# Patient Record
Sex: Male | Born: 1985 | Race: White | Hispanic: No | Marital: Married | Smoking: Former smoker
Health system: Southern US, Community
[De-identification: ages and names within clinical notes are randomized; demographics above are authoritative.]

## PROBLEM LIST (undated history)

## (undated) DIAGNOSIS — F32A Depression, unspecified: Secondary | ICD-10-CM

## (undated) DIAGNOSIS — T7840XA Allergy, unspecified, initial encounter: Secondary | ICD-10-CM

## (undated) DIAGNOSIS — L409 Psoriasis, unspecified: Secondary | ICD-10-CM

## (undated) HISTORY — PX: CHOLECYSTECTOMY: SHX55

## (undated) HISTORY — PX: TONSILLECTOMY: SUR1361

## (undated) HISTORY — DX: Allergy, unspecified, initial encounter: T78.40XA

## (undated) HISTORY — DX: Depression, unspecified: F32.A

---

## 2015-01-06 ENCOUNTER — Emergency Department
Admission: EM | Admit: 2015-01-06 | Discharge: 2015-01-06 | Disposition: A | Discharge status: Home / Self Care | Payer: 59 | Source: Home / Self Care | Attending: Family Medicine | Admitting: Family Medicine

## 2015-01-06 ENCOUNTER — Encounter: Payer: Self-pay | Admitting: *Deleted

## 2015-01-06 DIAGNOSIS — H6121 Impacted cerumen, right ear: Secondary | ICD-10-CM | POA: Diagnosis not present

## 2015-01-06 DIAGNOSIS — H6091 Unspecified otitis externa, right ear: Secondary | ICD-10-CM

## 2015-01-06 HISTORY — DX: Psoriasis, unspecified: L40.9

## 2015-01-06 MED ORDER — NEOMYCIN-POLYMYXIN-HC 3.5-10000-1 OT SUSP: 4.0000 [drp] | Freq: Four times a day (QID) | OTIC | Status: DC

## 2015-01-06 NOTE — ED Notes (Signed)
Pt c/o right ear fullness since last week with decreased hearing. Minimal pain.

## 2015-01-06 NOTE — ED Provider Notes (Signed)
CSN: [id number]     Arrival date & time 01/06/15  1217 History   First MD Initiated Contact with Patient 01/06/15 1238     Chief Complaint  Patient presents with  . Ear Fullness   (Consider location/radiation/quality/duration/timing/severity/associated sxs/prior Treatment) HPI  Pt is a 29yo male presenting to [facility] with c/o Right ear "fullness" and pain that started about 1 week ago.  Pt also reports mild decreased hearing in Right ear compared to Left. Reports mild nasal congestion that is intermittent, associated sneezing but states he has been around a lot of dust recently and believes the congestion is due to allergies.  No other symptoms.  Denies fever, chills, n/v/d. Denies sore throat.  Past Medical History  Diagnosis Date  . Psoriasis    Past Surgical History  Procedure Laterality Date  . Tonsillectomy     History reviewed. No pertinent family history. Social History  Substance Use Topics  . Smoking status: Former Research scientist (life sciences)  . Smokeless tobacco: Never Used  . Alcohol Use: Yes    Review of Systems  Constitutional: Negative for fever and chills.  HENT: Positive for congestion, ear pain (Right), hearing loss ("fullness") and sneezing. Negative for rhinorrhea, sinus pressure and sore throat.   Respiratory: Negative for cough and shortness of breath.   Gastrointestinal: Negative for nausea, vomiting, abdominal pain and diarrhea.    Allergies  Ceclor; Penicillins; and Sulfur  Home Medications   Prior to Admission medications   Medication Sig Start Date End Date Taking? Authorizing Provider  neomycin-polymyxin-hydrocortisone (CORTISPORIN) 3.5-10000-1 otic suspension Place 4 drops into the right ear 4 (four) times daily. X 7 days 01/06/15   Noland Fordyce, PA-C   Meds Ordered and Administered this Visit  Medications - No data to display  BP 115/74 mmHg  Pulse 73  Temp(Src) 98.1 F (36.7 C) (Oral)  Resp 14  Ht 5\' 9"  (1.753 m)  Wt 193 lb (87.544 kg)  BMI 28.49 kg/m2   SpO2 100% No data found.   Physical Exam  Constitutional: He is oriented to person, place, and time. He appears well-developed and well-nourished.  HENT:  Head: Normocephalic and atraumatic.  Right Ear: Hearing and tympanic membrane normal. There is swelling and tenderness.  Left Ear: Hearing, tympanic membrane, external ear and ear canal normal.  Right ear canal: small erythematous pustule on anterior wall of canal, erythematous canal.  Partial cerumen impaction. TM- normal.   Eyes: EOM are normal.  Neck: Normal range of motion.  Cardiovascular: Normal rate.   Pulmonary/Chest: Effort normal. No respiratory distress.  Musculoskeletal: Normal range of motion.  Neurological: He is alert and oriented to person, place, and time.  Skin: Skin is warm and dry.  Psychiatric: He has a normal mood and affect. His behavior is normal.  Nursing note and vitals reviewed.   ED Course  Procedures (including critical care time)  Labs Review Labs Reviewed - No data to display  Imaging Review No results found.   MDM   1. Acute otitis externa, right   2. Cerumen impaction, right     Pt c/o Right ear fullness. Partial cerumen impaction in Right ear with evidence of acute otitis externa. Cerumen removal performed by nurse. Pt reported immediate symptom relief. Rx: cortisporin otic drops for 7 days. F/u with PCP in 1 week if not improving, sooner if worsening. May take acetaminophen and ibuprofen for fever or pain. Patient verbalized understanding and agreement with treatment plan.    Noland Fordyce, PA-C 01/06/15 [phone]

## 2015-01-06 NOTE — Discharge Instructions (Signed)
Ear Drops, Adult °You have been diagnosed with a condition requiring you to put drops of medicine into your outer ear. °HOME CARE INSTRUCTIONS  °· Put drops in the affected ear as instructed. After putting the drops in, you will need to lie down with the affected ear facing up for ten minutes so the drops will remain in the ear canal and run down and fill the canal. Continue using the ear drops for as long as directed by your health care provider. °· Prior to getting up, put a cotton ball gently in your ear canal. Leave enough of the cotton ball out so it can be easily removed. Do not attempt to push this down into the canal with a cotton-tipped swab or other instrument. °· Do not irrigate or wash out your ears if you have had a perforated eardrum or mastoid surgery, or unless instructed to do so by your health care provider. °· Keep appointments with your health care provider as instructed. °· Finish all medicine, or use for the length of time prescribed by your health care provider. Continue the drops even if your problem seems to be doing well after a couple days, or continue as instructed. °SEEK MEDICAL CARE IF: °· You become worse or develop increasing pain. °· You notice any unusual drainage from your ear (particularly if the drainage has a bad smell). °· You develop hearing difficulties. °· You experience a serious form of dizziness in which you feel as if the room is spinning, and you feel nauseated (vertigo). °· The outside of your ear becomes red or swollen or both. This may be a sign of an allergic reaction. °MAKE SURE YOU:  °· Understand these instructions. °· Will watch your condition. °· Will get help right away if you are not doing well or get worse. °  °This information is not intended to replace advice given to you by your health care provider. Make sure you discuss any questions you have with your health care provider. °  °Document Released: 02/06/2001 Document Revised: 03/05/2014 Document  Reviewed: 09/09/2012 °Elsevier Interactive Patient Education ©2016 Elsevier Inc[employer] ° °Otitis Externa °Otitis externa is a bacterial or fungal infection of the outer ear canal. This is the area from the eardrum to the outside of the ear. Otitis externa is sometimes called "swimmer's ear." °CAUSES  °Possible causes of infection include: °· Swimming in dirty water. °· Moisture remaining in the ear after swimming or bathing. °· Mild injury (trauma) to the ear. °· Objects stuck in the ear (foreign body). °· Cuts or scrapes (abrasions) on the outside of the ear. °SIGNS AND SYMPTOMS  °The first symptom of infection is often itching in the ear canal. Later signs and symptoms may include swelling and redness of the ear canal, ear pain, and yellowish-white fluid (pus) coming from the ear. The ear pain may be worse when pulling on the earlobe. °DIAGNOSIS  °Your health care provider will perform a physical exam. A sample of fluid may be taken from the ear and examined for bacteria or fungi. °TREATMENT  °Antibiotic ear drops are often given for 10 to 14 days. Treatment may also include pain medicine or corticosteroids to reduce itching and swelling. °HOME CARE INSTRUCTIONS  °· Apply antibiotic ear drops to the ear canal as prescribed by your health care provider. °· Take medicines only as directed by your health care provider. °· If you have diabetes, follow any additional treatment instructions from your health care provider. °· Keep all follow-up visits   as directed by your health care provider. PREVENTION   Keep your ear dry. Use the corner of a towel to absorb water out of the ear canal after swimming or bathing.  Avoid scratching or putting objects inside your ear. This can damage the ear canal or remove the protective wax that lines the canal. This makes it easier for bacteria and fungi to grow.  Avoid swimming in lakes, polluted water, or poorly chlorinated pools.  You may use ear drops made of rubbing alcohol and  vinegar after swimming. Combine equal parts of white vinegar and alcohol in a bottle. Put 3 or 4 drops into each ear after swimming. SEEK MEDICAL CARE IF:   You have a fever.  Your ear is still red, swollen, painful, or draining pus after 3 days.  Your redness, swelling, or pain gets worse.  You have a severe headache.  You have redness, swelling, pain, or tenderness in the area behind your ear. MAKE SURE YOU:   Understand these instructions.  Will watch your condition.  Will get help right away if you are not doing well or get worse.   This information is not intended to replace advice given to you by your health care provider. Make sure you discuss any questions you have with your health care provider.   Document Released: 02/12/2005 Document Revised: 03/05/2014 Document Reviewed: 03/01/2011 Elsevier Interactive Patient Education 2016 [employer] The structures of the external ear canal secrete a waxy substance known as cerumen. Excess cerumen can build up in the ear canal, causing a condition known as cerumen impaction. Cerumen impaction can cause ear pain and disrupt the function of the ear. The rate of cerumen production differs for each individual. In certain individuals, the configuration of the ear canal may decrease his or her ability to naturally remove cerumen. CAUSES Cerumen impaction is caused by excessive cerumen production or buildup. RISK FACTORS  Frequent use of swabs to clean ears.  Having narrow ear canals.  Having eczema.  Being dehydrated. SIGNS AND SYMPTOMS  Diminished hearing.  Ear drainage.  Ear pain.  Ear itch. TREATMENT Treatment may involve:  Over-the-counter or prescription ear drops to soften the cerumen.  Removal of cerumen by a health care provider. This may be done with:  Irrigation with warm water. This is the most common method of removal.  Ear curettes and other instruments.  Surgery. This may be done  in severe cases. HOME CARE INSTRUCTIONS  Take medicines only as directed by your health care provider.  Do not insert objects into the ear with the intent of cleaning the ear. PREVENTION  Do not insert objects into the ear, even with the intent of cleaning the ear. Removing cerumen as a part of normal hygiene is not necessary, and the use of swabs in the ear canal is not recommended.  Drink enough water to keep your urine clear or pale yellow.  Control your eczema if you have it. SEEK MEDICAL CARE IF:  You develop ear pain.  You develop bleeding from the ear.  The cerumen does not clear after you use ear drops as directed.   This information is not intended to replace advice given to you by your health care provider. Make sure you discuss any questions you have with your health care provider.   Document Released: 03/22/2004 Document Revised: 03/05/2014 Document Reviewed: 09/29/2014 Elsevier Interactive Patient Education [employer].

## 2015-07-01 DIAGNOSIS — H5213 Myopia, bilateral: Secondary | ICD-10-CM | POA: Diagnosis not present

## 2015-07-01 DIAGNOSIS — H52223 Regular astigmatism, bilateral: Secondary | ICD-10-CM | POA: Diagnosis not present

## 2019-06-19 ENCOUNTER — Emergency Department
Admission: EM | Admit: 2019-06-19 | Discharge: 2019-06-19 | Disposition: A | Discharge status: Home / Self Care | Payer: BC Managed Care – PPO | Attending: Emergency Medicine | Admitting: Emergency Medicine

## 2019-06-19 ENCOUNTER — Emergency Department: Payer: BC Managed Care – PPO

## 2019-06-19 ENCOUNTER — Encounter: Payer: Self-pay | Admitting: Emergency Medicine

## 2019-06-19 ENCOUNTER — Other Ambulatory Visit: Payer: Self-pay

## 2019-06-19 DIAGNOSIS — Z87891 Personal history of nicotine dependence: Secondary | ICD-10-CM | POA: Insufficient documentation

## 2019-06-19 DIAGNOSIS — R1011 Right upper quadrant pain: Secondary | ICD-10-CM

## 2019-06-19 DIAGNOSIS — K8050 Calculus of bile duct without cholangitis or cholecystitis without obstruction: Secondary | ICD-10-CM

## 2019-06-19 DIAGNOSIS — R112 Nausea with vomiting, unspecified: Secondary | ICD-10-CM

## 2019-06-19 LAB — COMPREHENSIVE METABOLIC PANEL
ALT: 15 U/L (ref 0–44)
AST: 22 U/L (ref 15–41)
Albumin: 5.2 g/dL — ABNORMAL HIGH (ref 3.5–5.0)
Alkaline Phosphatase: 54 U/L (ref 38–126)
Anion gap: 13 (ref 5–15)
BUN: 17 mg/dL (ref 6–20)
CO2: 29 mmol/L (ref 22–32)
Calcium: 9.7 mg/dL (ref 8.9–10.3)
Chloride: 101 mmol/L (ref 98–111)
Creatinine, Ser: 0.92 mg/dL (ref 0.61–1.24)
GFR calc Af Amer: >60 mL/min (ref >60)
GFR calc non Af Amer: >60 mL/min (ref >60)
Glucose, Bld: 103 mg/dL — ABNORMAL HIGH (ref 70–99)
Potassium: 4.3 mmol/L (ref 3.5–5.1)
Sodium: 143 mmol/L (ref 135–145)
Total Bilirubin: 1.4 mg/dL — ABNORMAL HIGH (ref 0.3–1.2)
Total Protein: 8 g/dL (ref 6.5–8.1)

## 2019-06-19 LAB — URINALYSIS, COMPLETE (UACMP) WITH MICROSCOPIC
Bacteria, UA: NONE SEEN
Bilirubin Urine: NEGATIVE
Glucose, UA: NEGATIVE mg/dL
Hgb urine dipstick: NEGATIVE
Ketones, ur: 20 mg/dL — AB
Leukocytes,Ua: NEGATIVE
Nitrite: NEGATIVE
Protein, ur: NEGATIVE mg/dL
Specific Gravity, Urine: 1.025 (ref 1.005–1.030)
pH: 5 (ref 5.0–8.0)

## 2019-06-19 LAB — LIPASE, BLOOD: Lipase: 24 U/L (ref 11–51)

## 2019-06-19 LAB — CBC
HCT: 46.7 % (ref 39.0–52.0)
Hemoglobin: 16.4 g/dL (ref 13.0–17.0)
MCH: 29.2 pg (ref 26.0–34.0)
MCHC: 35.1 g/dL (ref 30.0–36.0)
MCV: 83.2 fL (ref 80.0–100.0)
Platelets: 214 10*3/uL (ref 150–400)
RBC: 5.61 MIL/uL (ref 4.22–5.81)
RDW: 12.5 % (ref 11.5–15.5)
WBC: 10.9 10*3/uL — ABNORMAL HIGH (ref 4.0–10.5)
nRBC: 0 % (ref 0.0–0.2)

## 2019-06-19 MED ORDER — MORPHINE SULFATE (PF) 4 MG/ML IV SOLN
4.0000 mg | Freq: Once | INTRAVENOUS | Status: AC
  Administered 2019-06-19: 10:00:00 4 mg via INTRAVENOUS
  Filled 2019-06-19: qty 1

## 2019-06-19 MED ORDER — MORPHINE SULFATE (PF) 4 MG/ML IV SOLN
4.0000 mg | Freq: Once | INTRAVENOUS | Status: AC
  Administered 2019-06-19: 4 mg via INTRAVENOUS
  Filled 2019-06-19: qty 1

## 2019-06-19 MED ORDER — ONDANSETRON HCL 4 MG/2ML IJ SOLN
4.0000 mg | Freq: Once | INTRAMUSCULAR | Status: AC
  Administered 2019-06-19: 4 mg via INTRAVENOUS
  Filled 2019-06-19: qty 2

## 2019-06-19 MED ORDER — ONDANSETRON 4 MG PO TBDP: 4.0000 mg | ORAL_TABLET | Freq: Three times a day (TID) | ORAL | 0 refills | Status: DC | PRN

## 2019-06-19 MED ORDER — LACTATED RINGERS IV BOLUS
1000.0000 mL | Freq: Once | INTRAVENOUS | Status: AC
  Administered 2019-06-19: 1000 mL via INTRAVENOUS

## 2019-06-19 MED ORDER — KETOROLAC TROMETHAMINE 30 MG/ML IJ SOLN
15.0000 mg | Freq: Once | INTRAMUSCULAR | Status: AC
  Administered 2019-06-19: 15 mg via INTRAVENOUS
  Filled 2019-06-19: qty 1

## 2019-06-19 MED ORDER — OXYCODONE-ACETAMINOPHEN 5-325 MG PO TABS: 1.0000 | ORAL_TABLET | ORAL | 0 refills | Status: AC | PRN

## 2019-06-19 NOTE — ED Triage Notes (Signed)
Pt reports RUQ pain that started 4 hour ago and sharp in nature that radiates through his back.   Pt reports has seen his MD for the same and had blood drawn Monday but it was all normal, Pt reports has has these symptoms intermittently for a while now.

## 2019-06-19 NOTE — ED Notes (Signed)
Pt alert and oriented X 4, stable for discharge. RR even and unlabored, color WNL. Discussed discharge instructions and follow up when appropriate. Instructed to follow up with ER for any life threatening symptoms or concerns that patient or family of patient may have  

## 2019-06-19 NOTE — ED Notes (Signed)
Pt transported to ultrasound.

## 2019-06-19 NOTE — ED Provider Notes (Signed)
[facility] Emergency Department Provider Note   ____________________________________________   First MD Initiated Contact with Patient 06/19/19 [phone]     (approximate)  I have reviewed the triage vital signs and the nursing notes.   HISTORY  Chief Complaint Abdominal Pain and Nausea    HPI Jesse Clark is a 34 y.o. male with no significant past medical history who presents to the ED complaining of abdominal pain.  Patient reports that he has been having intermittent episodes of right upper quadrant abdominal pain that radiate to underneath his right shoulder blade for the past 6 weeks.  They tend to come on a couple hours after fatty meals and then dissipate after a couple hours of discomfort.  They have become more frequent and the episode he experienced overnight was more severe than in the past.  It was associated with nausea but no vomiting, and he denies any changes in his bowel movements.  He has not had any fevers and denies any dysuria or hematuria.  He has never had surgery on his abdomen previously.        Past Medical History:  Diagnosis Date  . Psoriasis     There are no problems to display for this patient.   Past Surgical History:  Procedure Laterality Date  . TONSILLECTOMY      Prior to Admission medications   Medication Sig Start Date End Date Taking? Authorizing Provider  neomycin-polymyxin-hydrocortisone (CORTISPORIN) 3.5-10000-1 otic suspension Place 4 drops into the right ear 4 (four) times daily. X 7 days 01/06/15   Noe Gens, PA-C  ondansetron (ZOFRAN ODT) 4 MG disintegrating tablet Take 1 tablet (4 mg total) by mouth every 8 (eight) hours as needed for nausea or vomiting. 06/19/19   Blake Divine, MD  oxyCODONE-acetaminophen (PERCOCET) 5-325 MG tablet Take 1 tablet by mouth every 4 (four) hours as needed. 06/19/19 06/18/20  Blake Divine, MD    Allergies Ceclor [cefaclor], Penicillins, and Sulfur  No family  history on file.  Social History Social History   Tobacco Use  . Smoking status: Former Research scientist (life sciences)  . Smokeless tobacco: Never Used  Substance Use Topics  . Alcohol use: Yes  . Drug use: No    Review of Systems  Constitutional: No fever/chills Eyes: No visual changes. ENT: No sore throat. Cardiovascular: Denies chest pain. Respiratory: Denies shortness of breath. Gastrointestinal: Positive for abdominal pain.  Positive for nausea, no vomiting.  No diarrhea.  No constipation. Genitourinary: Negative for dysuria. Musculoskeletal: Negative for back pain. Skin: Negative for rash. Neurological: Negative for headaches, focal weakness or numbness.  ____________________________________________   PHYSICAL EXAM:  VITAL SIGNS: ED Triage Vitals  Enc Vitals Group     BP 06/19/19 0913 (!) 146/81     Pulse Rate 06/19/19 0913 72     Resp 06/19/19 0913 20     Temp 06/19/19 0913 98.1 F (36.7 C)     Temp Source 06/19/19 0913 Oral     SpO2 06/19/19 0913 100 %     Weight 06/19/19 0914 160 lb (72.6 kg)     Height 06/19/19 0914 5\' 9"  (1.753 m)     Head Circumference --      Peak Flow --      Pain Score 06/19/19 0914 7     Pain Loc --      Pain Edu? --      Excl. in [place]? --     Constitutional: Alert and oriented. Eyes: Conjunctivae are normal. Head: Atraumatic.  Nose: No congestion/rhinnorhea. Mouth/Throat: Mucous membranes are moist. Neck: Normal ROM Cardiovascular: Normal rate, regular rhythm. Grossly normal heart sounds. Respiratory: Normal respiratory effort.  No retractions. Lungs CTAB. Gastrointestinal: Soft and tender to palpation in the right upper quadrant with no rebound or guarding. No distention. Genitourinary: deferred Musculoskeletal: No lower extremity tenderness nor edema. Neurologic:  Normal speech and language. No gross focal neurologic deficits are appreciated. Skin:  Skin is warm, dry and intact. No rash noted. Psychiatric: Mood and affect are normal. Speech and  behavior are normal.  ____________________________________________   LABS (all labs ordered are listed, but only abnormal results are displayed)  Labs Reviewed  COMPREHENSIVE METABOLIC PANEL - Abnormal; Notable for the following components:      Result Value   Glucose, Bld 103 (*)    Albumin 5.2 (*)    Total Bilirubin 1.4 (*)    All other components within normal limits  CBC - Abnormal; Notable for the following components:   WBC 10.9 (*)    All other components within normal limits  URINALYSIS, COMPLETE (UACMP) WITH MICROSCOPIC - Abnormal; Notable for the following components:   Color, Urine YELLOW (*)    APPearance CLEAR (*)    Ketones, ur 20 (*)    All other components within normal limits  LIPASE, BLOOD   ____________________________________________  EKG  ED ECG REPORT I, Blake Divine, the attending physician, personally viewed and interpreted this ECG.   Date: 06/19/2019  EKG Time: 9:20  Rate: 76  Rhythm: normal sinus rhythm  Axis: Normal  Intervals:none  ST&T Change: None   PROCEDURES  Procedure(s) performed (including Critical Care):  Procedures   ____________________________________________   INITIAL IMPRESSION / ASSESSMENT AND PLAN / ED COURSE       34 year old male with no significant past medical history presents to the ED complaining of intermittent right upper quadrant abdominal pain radiating to his shoulder blade over the past 6 weeks, often associated with fatty meals.  Pain is reproducible with palpation of his right upper quadrant and I would be most suspicious for cholecystitis versus biliary colic.  We will further assess with ultrasound, lab work thus far is remarkable only for very mildly elevated bilirubin and white count.  Plan to treat pain with IV morphine, also give IV Zofran and fluids.  No evidence of infection on UA.  Right upper quadrant ultrasound shows gallstones with no evidence of cholecystitis.  Patient's pain now gradually  improving following IV Toradol.  He is appropriate for discharge home with general surgery follow-up, will prescribe short course of pain medication as well as Zofran.  He was counseled to return to the ED for new or worsening symptoms, patient agrees with plan.      ____________________________________________   FINAL CLINICAL IMPRESSION(S) / ED DIAGNOSES  Final diagnoses:  Biliary colic     ED Discharge Orders         Ordered    oxyCODONE-acetaminophen (PERCOCET) 5-325 MG tablet  Every 4 hours PRN     06/19/19 1137    ondansetron (ZOFRAN ODT) 4 MG disintegrating tablet  Every 8 hours PRN     06/19/19 1137           Note:  This document was prepared using Dragon voice recognition software and may include unintentional dictation errors.   Blake Divine, MD 06/19/19 [phone]

## 2019-06-26 ENCOUNTER — Telehealth: Payer: Self-pay | Admitting: *Deleted

## 2019-06-26 NOTE — Telephone Encounter (Signed)
Patients wife called to see about getting the patient in today to see Dr Hampton Abbot. Patient is complaining of pain to where his percocet is not helping. I reached out to Dr Hampton Abbot and he stated that he could be experiencing cholecystitis and may potentially need to be admitted. If he is having nausea and vomiting even more reason to go to the ER

## 2019-07-01 ENCOUNTER — Telehealth: Payer: Self-pay | Admitting: Surgery

## 2019-07-01 ENCOUNTER — Encounter: Payer: Self-pay | Admitting: Surgery

## 2019-07-01 ENCOUNTER — Ambulatory Visit: Payer: BC Managed Care – PPO | Admitting: Surgery

## 2019-07-01 ENCOUNTER — Other Ambulatory Visit
Admission: RE | Admit: 2019-07-01 | Discharge: 2019-07-01 | Disposition: A | Discharge status: Home / Self Care | Payer: BC Managed Care – PPO | Source: Ambulatory Visit | Attending: Surgery | Admitting: Surgery

## 2019-07-01 ENCOUNTER — Other Ambulatory Visit: Payer: Self-pay

## 2019-07-01 VITALS — BP 126/78 | HR 89 | Temp 97.7°F | Resp 12 | Ht 69.0 in | Wt 155.0 lb

## 2019-07-01 DIAGNOSIS — Z20822 Contact with and (suspected) exposure to covid-19: Secondary | ICD-10-CM | POA: Insufficient documentation

## 2019-07-01 DIAGNOSIS — Z01812 Encounter for preprocedural laboratory examination: Secondary | ICD-10-CM | POA: Insufficient documentation

## 2019-07-01 DIAGNOSIS — K811 Chronic cholecystitis: Secondary | ICD-10-CM | POA: Diagnosis not present

## 2019-07-01 LAB — SARS CORONAVIRUS 2 (TAT 6-24 HRS): SARS Coronavirus 2: NEGATIVE

## 2019-07-01 NOTE — Telephone Encounter (Signed)
Pt has been advised of Pre-Admission date/time, COVID Testing date and Surgery date.  Surgery Date: 07/02/19 Preadmission Testing Date: 07/01/19 (phone 1p-5p) Covid Testing Date: 07/01/19 - patient advised to go to the [facility] ([place]) between [employer] with patient in person as he was an urgent case added on by Dr. Dahlia Byes.  Spoke with Jackelyn Poling in same day surgery who informed patient to arrive @ 1:00 on 07/02/19 for surgery.  Patient aware, instructions given by nurse, patient voices understanding.

## 2019-07-01 NOTE — Progress Notes (Signed)
Surgical Consultation  07/01/2019  Jesse Clark is an 34 y.o. male.   Chief Complaint  Patient presents with  . New Patient (Initial Visit)    Gallstone     HPI: Jesse Clark is a 34 year old male seen in consultation at the request of Dr. Charna Archer.  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  He comes in with a 6-week history of Right upper abdominal pain.  He reports that the pain is sharp intermittent and moderate intensity.  He did have a visit to the emergency room 2 weeks ago due to severe pain.  The pain lasted about 6 hours at that time.  Ultrasound personally reviewed showing evidence of cholelithiasis without cholecystitis.  Normal common bile duct.  CMP completely normal except mild elevation of the total bilirubin that is likely related to the acute injury and fasting.  He denies any fevers any chills no evidence of biliary obstruction.  Over the last couple weeks his symptoms have been crescendo and he has been on a bland diet.  Symptoms seems to be aggravated by fatty meals.  He did have some nausea and decreased appetite. Is able to perform more than 6 METS of activity without any shortness of breath or chest pain.  No previous abdominal operations. Previous operations tonsillectomy  Past Medical History:  Diagnosis Date  . Psoriasis     Past Surgical History:  Procedure Laterality Date  . TONSILLECTOMY      Family History  Problem Relation Age of Onset  . Hyperthyroidism Mother   . Hypertension Father   . Hyperlipidemia Father     Social History:  reports that he has quit smoking. He has never used smokeless tobacco. He reports current alcohol use. He reports that he does not use drugs.  Allergies:  Allergies  Allergen Reactions  . Ceclor [Cefaclor]   . Penicillins   . Sulfur     Medications reviewed.     ROS Full ROS performed and is otherwise negative other than what is stated in the HPI    BP 126/78   Pulse 89    Temp 97.7 F (36.5 C)   Resp 12   Ht 5\' 9"  (1.753 m)   Wt 155 lb (70.3 kg)   SpO2 98%   BMI 22.89 kg/m   Physical Exam Vitals and nursing note reviewed. Exam conducted with a chaperone present.  Constitutional:      General: He is not in acute distress.    Appearance: Normal appearance. He is normal weight. He is not toxic-appearing.  Cardiovascular:     Rate and Rhythm: Normal rate and regular rhythm.     Pulses: Normal pulses.     Heart sounds: No murmur.  Pulmonary:     Effort: Pulmonary effort is normal. No respiratory distress.     Breath sounds: Normal breath sounds. No stridor. No wheezing or rhonchi.  Abdominal:     General: Abdomen is flat. There is no distension.     Palpations: Abdomen is soft. There is no mass.     Tenderness: There is no right CVA tenderness, left CVA tenderness, guarding or rebound.     Hernia: No hernia is present.     Comments: Mild tenderness and discomfort RUQ  Musculoskeletal:        General: No swelling or tenderness. Normal range of motion.     Cervical back: Normal range of motion and neck supple. No rigidity or tenderness.  Skin:  General: Skin is warm and dry.     Capillary Refill: Capillary refill takes less than 2 seconds.  Neurological:     General: No focal deficit present.     Mental Status: He is alert and oriented to person, place, and time. Mental status is at baseline.  Psychiatric:        Mood and Affect: Mood normal.        Behavior: Behavior normal.        Thought Content: Thought content normal.        Judgment: Judgment normal.        Assessment/Plan: 34 year old male with findings consistent with biliary colic/chronic cholecystitis.  He has crescendo symptoms and is impairing his daily life.  He wishes to have his surgery done as soon as possible I also recommend prompt intervention given his clinical findings.I discussed the procedure in detail.  The patient was given Neurosurgeon.  We discussed the  risks and benefits of a laparoscopic cholecystectomy and possible cholangiogram including, but not limited to bleeding, infection, injury to surrounding structures such as the intestine or liver, bile leak, retained gallstones, need to convert to an open procedure, prolonged diarrhea, blood clots such as  DVT, common bile duct injury, anesthesia risks, and possible need for additional procedures.  The likelihood of improvement in symptoms and return to the patient's normal status is good. We discussed the typical post-operative recovery course.  I do think that he is a good operative candidate for robotic approach.  We will expedite his surgery and place him on the schedule tomorrow afternoon.  Hopefully will have enough time to get his Covid testing before the case. A copy of this report was sent to the referring provider     Caroleen Hamman, MD [facility] General Surgeon

## 2019-07-01 NOTE — H&P (View-Only) (Signed)
Surgical Consultation  07/01/2019  Jesse Clark is an 34 y.o. male.   Chief Complaint  Patient presents with  . New Patient (Initial Visit)    Gallstone     HPI: Jesse Clark is a 34 year old male seen in consultation at the request of Dr. Charna Archer.  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  He comes in with a 6-week history of Right upper abdominal pain.  He reports that the pain is sharp intermittent and moderate intensity.  He did have a visit to the emergency room 2 weeks ago due to severe pain.  The pain lasted about 6 hours at that time.  Ultrasound personally reviewed showing evidence of cholelithiasis without cholecystitis.  Normal common bile duct.  CMP completely normal except mild elevation of the total bilirubin that is likely related to the acute injury and fasting.  He denies any fevers any chills no evidence of biliary obstruction.  Over the last couple weeks his symptoms have been crescendo and he has been on a bland diet.  Symptoms seems to be aggravated by fatty meals.  He did have some nausea and decreased appetite. Is able to perform more than 6 METS of activity without any shortness of breath or chest pain.  No previous abdominal operations. Previous operations tonsillectomy  Past Medical History:  Diagnosis Date  . Psoriasis     Past Surgical History:  Procedure Laterality Date  . TONSILLECTOMY      Family History  Problem Relation Age of Onset  . Hyperthyroidism Mother   . Hypertension Father   . Hyperlipidemia Father     Social History:  reports that he has quit smoking. He has never used smokeless tobacco. He reports current alcohol use. He reports that he does not use drugs.  Allergies:  Allergies  Allergen Reactions  . Ceclor [Cefaclor]   . Penicillins   . Sulfur     Medications reviewed.     ROS Full ROS performed and is otherwise negative other than what is stated in the HPI    BP 126/78   Pulse 89    Temp 97.7 F (36.5 C)   Resp 12   Ht 5\' 9"  (1.753 m)   Wt 155 lb (70.3 kg)   SpO2 98%   BMI 22.89 kg/m   Physical Exam Vitals and nursing note reviewed. Exam conducted with a chaperone present.  Constitutional:      General: He is not in acute distress.    Appearance: Normal appearance. He is normal weight. He is not toxic-appearing.  Cardiovascular:     Rate and Rhythm: Normal rate and regular rhythm.     Pulses: Normal pulses.     Heart sounds: No murmur.  Pulmonary:     Effort: Pulmonary effort is normal. No respiratory distress.     Breath sounds: Normal breath sounds. No stridor. No wheezing or rhonchi.  Abdominal:     General: Abdomen is flat. There is no distension.     Palpations: Abdomen is soft. There is no mass.     Tenderness: There is no right CVA tenderness, left CVA tenderness, guarding or rebound.     Hernia: No hernia is present.     Comments: Mild tenderness and discomfort RUQ  Musculoskeletal:        General: No swelling or tenderness. Normal range of motion.     Cervical back: Normal range of motion and neck supple. No rigidity or tenderness.  Skin:  General: Skin is warm and dry.     Capillary Refill: Capillary refill takes less than 2 seconds.  Neurological:     General: No focal deficit present.     Mental Status: He is alert and oriented to person, place, and time. Mental status is at baseline.  Psychiatric:        Mood and Affect: Mood normal.        Behavior: Behavior normal.        Thought Content: Thought content normal.        Judgment: Judgment normal.        Assessment/Plan: 34 year old male with findings consistent with biliary colic/chronic cholecystitis.  He has crescendo symptoms and is impairing his daily life.  He wishes to have his surgery done as soon as possible I also recommend prompt intervention given his clinical findings.I discussed the procedure in detail.  The patient was given Neurosurgeon.  We discussed the  risks and benefits of a laparoscopic cholecystectomy and possible cholangiogram including, but not limited to bleeding, infection, injury to surrounding structures such as the intestine or liver, bile leak, retained gallstones, need to convert to an open procedure, prolonged diarrhea, blood clots such as  DVT, common bile duct injury, anesthesia risks, and possible need for additional procedures.  The likelihood of improvement in symptoms and return to the patient's normal status is good. We discussed the typical post-operative recovery course.  I do think that he is a good operative candidate for robotic approach.  We will expedite his surgery and place him on the schedule tomorrow afternoon.  Hopefully will have enough time to get his Covid testing before the case. A copy of this report was sent to the referring provider     Caroleen Hamman, MD [facility] General Surgeon

## 2019-07-01 NOTE — Patient Instructions (Addendum)
Please head over to the [facility] building to get swabbed for Covid. Pamala Hurry went over your surgery information with you.   Laparoscopic Cholecystectomy Laparoscopic cholecystectomy is surgery to remove the gallbladder. The gallbladder is a pear-shaped organ that lies beneath the liver on the right side of the body. The gallbladder stores bile, which is a fluid that helps the body to digest fats. Cholecystectomy is often done for inflammation of the gallbladder (cholecystitis). This condition is usually caused by a buildup of gallstones (cholelithiasis) in the gallbladder. Gallstones can block the flow of bile, which can result in inflammation and pain. In severe cases, emergency surgery may be required. This procedure is done though small incisions in your abdomen (laparoscopic surgery). A thin scope with a camera (laparoscope) is inserted through one incision. Thin surgical instruments are inserted through the other incisions. In some cases, a laparoscopic procedure may be turned into a type of surgery that is done through a larger incision (open surgery). Tell a health care provider about:  Any allergies you have.  All medicines you are taking, including vitamins, herbs, eye drops, creams, and over-the-counter medicines.  Any problems you or family members have had with anesthetic medicines.  Any blood disorders you have.  Any surgeries you have had.  Any medical conditions you have.  Whether you are pregnant or may be pregnant. What are the risks? Generally, this is a safe procedure. However, problems may occur, including:  Infection.  Bleeding.  Allergic reactions to medicines.  Damage to other structures or organs.  A stone remaining in the common bile duct. The common bile duct carries bile from the gallbladder into the small intestine.  A bile leak from the cyst duct that is clipped when your gallbladder is removed. What happens before the procedure? Staying  hydrated Follow instructions from your health care provider about hydration, which may include:  Up to 2 hours before the procedure - you may continue to drink clear liquids, such as water, clear fruit juice, black coffee, and plain tea. Eating and drinking restrictions Follow instructions from your health care provider about eating and drinking, which may include:  8 hours before the procedure - stop eating heavy meals or foods such as meat, fried foods, or fatty foods.  6 hours before the procedure - stop eating light meals or foods, such as toast or cereal.  6 hours before the procedure - stop drinking milk or drinks that contain milk.  2 hours before the procedure - stop drinking clear liquids. Medicines  Ask your health care provider about: ? Changing or stopping your regular medicines. This is especially important if you are taking diabetes medicines or blood thinners. ? Taking medicines such as aspirin and ibuprofen. These medicines can thin your blood. Do not take these medicines before your procedure if your health care provider instructs you not to.  You may be given antibiotic medicine to help prevent infection. General instructions  Let your health care provider know if you develop a cold or an infection before surgery.  Plan to have someone take you home from the hospital or clinic.  Ask your health care provider how your surgical site will be marked or identified. What happens during the procedure?   To reduce your risk of infection: ? Your health care team will wash or sanitize their hands. ? Your skin will be washed with soap. ? Hair may be removed from the surgical area.  An IV tube may be inserted into one of  your veins.  You will be given one or more of the following: ? A medicine to help you relax (sedative). ? A medicine to make you fall asleep (general anesthetic).  A breathing tube will be placed in your mouth.  Your surgeon will make several small  cuts (incisions) in your abdomen.  The laparoscope will be inserted through one of the small incisions. The camera on the laparoscope will send images to a TV screen (monitor) in the operating room. This lets your surgeon see inside your abdomen.  Air-like gas will be pumped into your abdomen. This will expand your abdomen to give the surgeon more room to perform the surgery.  Other tools that are needed for the procedure will be inserted through the other incisions. The gallbladder will be removed through one of the incisions.  Your common bile duct may be examined. If stones are found in the common bile duct, they may be removed.  After your gallbladder has been removed, the incisions will be closed with stitches (sutures), staples, or skin glue.  Your incisions may be covered with a bandage (dressing). The procedure may vary among health care providers and hospitals. What happens after the procedure?  Your blood pressure, heart rate, breathing rate, and blood oxygen level will be monitored until the medicines you were given have worn off.  You will be given medicines as needed to control your pain.  Do not drive for 24 hours if you were given a sedative. This information is not intended to replace advice given to you by your health care provider. Make sure you discuss any questions you have with your health care provider. Document Revised: 01/25/2017 Document Reviewed: 08/01/2015 Elsevier Patient Education  [place].

## 2019-07-02 ENCOUNTER — Encounter: Payer: Self-pay | Admitting: Surgery

## 2019-07-02 ENCOUNTER — Ambulatory Visit
Admission: RE | Admit: 2019-07-02 | Discharge: 2019-07-02 | Disposition: A | Discharge status: Home / Self Care | Payer: BC Managed Care – PPO | Attending: Surgery | Admitting: Surgery

## 2019-07-02 ENCOUNTER — Encounter
Admission: RE | Disposition: A | Discharge status: Home / Self Care | Payer: Self-pay | Source: Home / Self Care | Attending: Surgery

## 2019-07-02 ENCOUNTER — Ambulatory Visit: Payer: BC Managed Care – PPO | Admitting: Anesthesiology

## 2019-07-02 DIAGNOSIS — K811 Chronic cholecystitis: Secondary | ICD-10-CM

## 2019-07-02 DIAGNOSIS — K8010 Calculus of gallbladder with chronic cholecystitis without obstruction: Secondary | ICD-10-CM | POA: Diagnosis not present

## 2019-07-02 DIAGNOSIS — Z87891 Personal history of nicotine dependence: Secondary | ICD-10-CM | POA: Insufficient documentation

## 2019-07-02 SURGERY — CHOLECYSTECTOMY, ROBOT-ASSISTED, LAPAROSCOPIC
Anesthesia: General | Site: Abdomen

## 2019-07-02 MED ORDER — FENTANYL CITRATE (PF) 100 MCG/2ML IJ SOLN
INTRAMUSCULAR | Status: AC
  Filled 2019-07-02: qty 2

## 2019-07-02 MED ORDER — EPHEDRINE SULFATE 50 MG/ML IJ SOLN
INTRAMUSCULAR | Status: DC | PRN
  Administered 2019-07-02: 5 mg via INTRAVENOUS

## 2019-07-02 MED ORDER — ONDANSETRON HCL 4 MG/2ML IJ SOLN
INTRAMUSCULAR | Status: AC
  Filled 2019-07-02: qty 2

## 2019-07-02 MED ORDER — MIDAZOLAM HCL 2 MG/2ML IJ SOLN
INTRAMUSCULAR | Status: DC | PRN
  Administered 2019-07-02: 2 mg via INTRAVENOUS

## 2019-07-02 MED ORDER — GLYCOPYRROLATE 0.2 MG/ML IJ SOLN
INTRAMUSCULAR | Status: DC | PRN
  Administered 2019-07-02: .2 mg via INTRAVENOUS

## 2019-07-02 MED ORDER — CHLORHEXIDINE GLUCONATE CLOTH 2 % EX PADS: 6.0000 | MEDICATED_PAD | Freq: Once | CUTANEOUS | Status: DC

## 2019-07-02 MED ORDER — HYDROCODONE-ACETAMINOPHEN 5-325 MG PO TABS
ORAL_TABLET | ORAL | Status: AC
  Filled 2019-07-02: qty 1

## 2019-07-02 MED ORDER — BUPIVACAINE-EPINEPHRINE (PF) 0.25% -1:200000 IJ SOLN
INTRAMUSCULAR | Status: DC | PRN
  Administered 2019-07-02: 30 mL via PERINEURAL

## 2019-07-02 MED ORDER — DEXMEDETOMIDINE HCL IN NACL 200 MCG/50ML IV SOLN
INTRAVENOUS | Status: DC | PRN
Start: 2019-07-02 — End: 2019-07-02
  Administered 2019-07-02: 8 ug via INTRAVENOUS

## 2019-07-02 MED ORDER — ONDANSETRON HCL 4 MG/2ML IJ SOLN
4.0000 mg | Freq: Once | INTRAMUSCULAR | Status: AC | PRN
  Administered 2019-07-02: 4 mg via INTRAVENOUS

## 2019-07-02 MED ORDER — DEXAMETHASONE SODIUM PHOSPHATE 10 MG/ML IJ SOLN
INTRAMUSCULAR | Status: AC
  Filled 2019-07-02: qty 1

## 2019-07-02 MED ORDER — FAMOTIDINE 20 MG PO TABS: 20.0000 mg | ORAL_TABLET | Freq: Once | ORAL | Status: AC

## 2019-07-02 MED ORDER — GABAPENTIN 300 MG PO CAPS: 300.0000 mg | ORAL_CAPSULE | ORAL | Status: AC

## 2019-07-02 MED ORDER — PROPOFOL 10 MG/ML IV BOLUS
INTRAVENOUS | Status: DC | PRN
  Administered 2019-07-02: 150 mg via INTRAVENOUS

## 2019-07-02 MED ORDER — INDOCYANINE GREEN 25 MG IV SOLR
7.5000 mg | Freq: Once | INTRAVENOUS | Status: AC
  Administered 2019-07-02: 13:00:00 7.5 mg via INTRAVENOUS
  Filled 2019-07-02: qty 10

## 2019-07-02 MED ORDER — ONDANSETRON HCL 4 MG/2ML IJ SOLN
INTRAMUSCULAR | Status: AC
  Filled 2019-07-02: qty 4

## 2019-07-02 MED ORDER — ACETAMINOPHEN 500 MG PO TABS
ORAL_TABLET | ORAL | Status: AC
  Administered 2019-07-02: 1000 mg via ORAL
  Filled 2019-07-02: qty 2

## 2019-07-02 MED ORDER — LACTATED RINGERS IV SOLN: INTRAVENOUS | Status: DC

## 2019-07-02 MED ORDER — KETOROLAC TROMETHAMINE 30 MG/ML IJ SOLN
INTRAMUSCULAR | Status: AC
  Filled 2019-07-02: qty 1

## 2019-07-02 MED ORDER — FENTANYL CITRATE (PF) 100 MCG/2ML IJ SOLN
INTRAMUSCULAR | Status: DC | PRN
  Administered 2019-07-02 (×2): 50 ug via INTRAVENOUS

## 2019-07-02 MED ORDER — FAMOTIDINE 20 MG PO TABS
ORAL_TABLET | ORAL | Status: AC
  Administered 2019-07-02: 20 mg via ORAL
  Filled 2019-07-02: qty 1

## 2019-07-02 MED ORDER — GABAPENTIN 300 MG PO CAPS
ORAL_CAPSULE | ORAL | Status: AC
  Administered 2019-07-02: 300 mg via ORAL
  Filled 2019-07-02: qty 1

## 2019-07-02 MED ORDER — FENTANYL CITRATE (PF) 100 MCG/2ML IJ SOLN
25.0000 ug | INTRAMUSCULAR | Status: DC | PRN
  Administered 2019-07-02 (×5): 25 ug via INTRAVENOUS

## 2019-07-02 MED ORDER — SODIUM CHLORIDE (PF) 0.9 % IJ SOLN
INTRAMUSCULAR | Status: AC
  Filled 2019-07-02: qty 10

## 2019-07-02 MED ORDER — CELECOXIB 200 MG PO CAPS
ORAL_CAPSULE | ORAL | Status: AC
  Administered 2019-07-02: 200 mg via ORAL
  Filled 2019-07-02: qty 1

## 2019-07-02 MED ORDER — CLINDAMYCIN PHOSPHATE 900 MG/50ML IV SOLN
INTRAVENOUS | Status: AC
  Filled 2019-07-02: qty 50

## 2019-07-02 MED ORDER — ACETAMINOPHEN 500 MG PO TABS: 1000.0000 mg | ORAL_TABLET | ORAL | Status: AC

## 2019-07-02 MED ORDER — LACTATED RINGERS IV SOLN: INTRAVENOUS | Status: DC | PRN

## 2019-07-02 MED ORDER — GLYCOPYRROLATE 0.2 MG/ML IJ SOLN
INTRAMUSCULAR | Status: AC
  Filled 2019-07-02: qty 1

## 2019-07-02 MED ORDER — SUGAMMADEX SODIUM 500 MG/5ML IV SOLN
INTRAVENOUS | Status: DC | PRN
  Administered 2019-07-02: 400 mg via INTRAVENOUS

## 2019-07-02 MED ORDER — HYDROCODONE-ACETAMINOPHEN 5-325 MG PO TABS
1.0000 | ORAL_TABLET | Freq: Four times a day (QID) | ORAL | Status: DC | PRN
  Administered 2019-07-02: 1 via ORAL

## 2019-07-02 MED ORDER — CELECOXIB 200 MG PO CAPS: 200.0000 mg | ORAL_CAPSULE | ORAL | Status: AC

## 2019-07-02 MED ORDER — VASOPRESSIN 20 UNIT/ML IV SOLN
INTRAVENOUS | Status: DC | PRN
  Administered 2019-07-02: 2 [IU] via INTRAVENOUS

## 2019-07-02 MED ORDER — CLINDAMYCIN PHOSPHATE 900 MG/50ML IV SOLN
900.0000 mg | INTRAVENOUS | Status: AC
  Administered 2019-07-02: 900 mg via INTRAVENOUS

## 2019-07-02 MED ORDER — MIDAZOLAM HCL 2 MG/2ML IJ SOLN
INTRAMUSCULAR | Status: AC
  Filled 2019-07-02: qty 2

## 2019-07-02 MED ORDER — ROCURONIUM BROMIDE 100 MG/10ML IV SOLN
INTRAVENOUS | Status: DC | PRN
  Administered 2019-07-02: 50 mg via INTRAVENOUS

## 2019-07-02 MED ORDER — HYDROCODONE-ACETAMINOPHEN 5-325 MG PO TABS: 1.0000 | ORAL_TABLET | Freq: Four times a day (QID) | ORAL | 0 refills | Status: DC | PRN

## 2019-07-02 MED ORDER — PHENYLEPHRINE HCL (PRESSORS) 10 MG/ML IV SOLN
INTRAVENOUS | Status: DC | PRN
  Administered 2019-07-02 (×2): 100 ug via INTRAVENOUS

## 2019-07-02 MED ORDER — LIDOCAINE HCL (CARDIAC) PF 100 MG/5ML IV SOSY
PREFILLED_SYRINGE | INTRAVENOUS | Status: DC | PRN
  Administered 2019-07-02: 60 mg via INTRAVENOUS

## 2019-07-02 MED ORDER — DEXAMETHASONE SODIUM PHOSPHATE 10 MG/ML IJ SOLN
INTRAMUSCULAR | Status: DC | PRN
  Administered 2019-07-02: 10 mg via INTRAVENOUS

## 2019-07-02 MED ORDER — ONDANSETRON HCL 4 MG/2ML IJ SOLN
INTRAMUSCULAR | Status: DC | PRN
  Administered 2019-07-02: 4 mg via INTRAVENOUS

## 2019-07-02 MED ORDER — FENTANYL CITRATE (PF) 100 MCG/2ML IJ SOLN
INTRAMUSCULAR | Status: AC
  Administered 2019-07-02: 25 ug via INTRAVENOUS
  Filled 2019-07-02: qty 2

## 2019-07-02 SURGICAL SUPPLY — 45 items
CANISTER SUCT 1200ML W/VALVE (MISCELLANEOUS) ×3 IMPLANT
CHLORAPREP W/TINT 26 (MISCELLANEOUS) ×3 IMPLANT
CLIP VESOLOCK MED LG 6/CT (CLIP) ×3 IMPLANT
COVER WAND RF STERILE (DRAPES) ×3 IMPLANT
DECANTER SPIKE VIAL GLASS SM (MISCELLANEOUS) ×3 IMPLANT
DEFOGGER SCOPE WARMER CLEARIFY (MISCELLANEOUS) ×3 IMPLANT
DERMABOND ADVANCED (GAUZE/BANDAGES/DRESSINGS) ×2
DERMABOND ADVANCED .7 DNX12 (GAUZE/BANDAGES/DRESSINGS) ×1 IMPLANT
DRAPE ARM DVNC X/XI (DISPOSABLE) ×4 IMPLANT
DRAPE COLUMN DVNC XI (DISPOSABLE) ×1 IMPLANT
DRAPE DA VINCI XI ARM (DISPOSABLE) ×8
DRAPE DA VINCI XI COLUMN (DISPOSABLE) ×2
ELECT CAUTERY BLADE 6.4 (BLADE) ×3 IMPLANT
ELECT REM PT RETURN 9FT ADLT (ELECTROSURGICAL) ×3
ELECTRODE REM PT RTRN 9FT ADLT (ELECTROSURGICAL) ×1 IMPLANT
GLOVE BIO SURGEON STRL SZ7 (GLOVE) ×6 IMPLANT
GOWN STRL REUS W/ TWL LRG LVL3 (GOWN DISPOSABLE) ×4 IMPLANT
GOWN STRL REUS W/TWL LRG LVL3 (GOWN DISPOSABLE) ×8
IRRIGATION STRYKERFLOW (MISCELLANEOUS) IMPLANT
IRRIGATOR STRYKERFLOW (MISCELLANEOUS)
IV NS 1000ML (IV SOLUTION)
IV NS 1000ML BAXH (IV SOLUTION) IMPLANT
KIT PINK PAD W/HEAD ARE REST (MISCELLANEOUS) ×3
KIT PINK PAD W/HEAD ARM REST (MISCELLANEOUS) ×1 IMPLANT
LABEL OR SOLS (LABEL) ×3 IMPLANT
NEEDLE HYPO 22GX1.5 SAFETY (NEEDLE) ×3 IMPLANT
NS IRRIG 500ML POUR BTL (IV SOLUTION) ×3 IMPLANT
OBTURATOR OPTICAL STANDARD 8MM (TROCAR) ×2
OBTURATOR OPTICAL STND 8 DVNC (TROCAR) ×1
OBTURATOR OPTICALSTD 8 DVNC (TROCAR) ×1 IMPLANT
PACK LAP CHOLECYSTECTOMY (MISCELLANEOUS) ×3 IMPLANT
PENCIL ELECTRO HAND CTR (MISCELLANEOUS) ×3 IMPLANT
POUCH SPECIMEN RETRIEVAL 10MM (ENDOMECHANICALS) ×3 IMPLANT
SEAL CANN UNIV 5-8 DVNC XI (MISCELLANEOUS) ×4 IMPLANT
SEAL XI 5MM-8MM UNIVERSAL (MISCELLANEOUS) ×8
SET TUBE SMOKE EVAC HIGH FLOW (TUBING) ×3 IMPLANT
SOLUTION ELECTROLUBE (MISCELLANEOUS) ×3 IMPLANT
SPONGE LAP 18X18 RF (DISPOSABLE) ×3 IMPLANT
STAPLER CANNULA SEAL DVNC XI (STAPLE) ×1 IMPLANT
STAPLER CANNULA SEAL XI (STAPLE) ×2
SUT MNCRL AB 4-0 PS2 18 (SUTURE) ×3 IMPLANT
SUT VICRYL 0 AB UR-6 (SUTURE) ×6 IMPLANT
TAPE TRANSPORE STRL 2 31045 (GAUZE/BANDAGES/DRESSINGS) ×3 IMPLANT
TROCAR 130MM GELPORT  DAV (MISCELLANEOUS) ×2 IMPLANT
TROCAR BALLN GELPORT 12X130M (ENDOMECHANICALS) ×3 IMPLANT

## 2019-07-02 NOTE — Interval H&P Note (Signed)
History and Physical Interval Note:  07/02/2019 12:26 PM  Jesse Clark  has presented today for surgery, with the diagnosis of Biliary Colie.  The various methods of treatment have been discussed with the patient and family. After consideration of risks, benefits and other options for treatment, the patient has consented to  Procedure(s): XI ROBOTIC [place] (N/A) as a surgical intervention.  The patient's history has been reviewed, patient examined, no change in status, stable for surgery.  I have reviewed the patient's chart and labs.  Questions were answered to the patient's satisfaction.     Jenner

## 2019-07-02 NOTE — Anesthesia Preprocedure Evaluation (Addendum)
Anesthesia Evaluation  Patient identified by MRN, date of birth, ID band Patient awake    Reviewed: Allergy & Precautions, H&P , NPO status , Patient's Chart, lab work & pertinent test results, reviewed documented beta blocker date and time   Airway Mallampati: II  TM Distance: >3 FB Neck ROM: full    Dental  (+) Teeth Intact   Pulmonary neg pulmonary ROS, former smoker,    Pulmonary exam normal        Cardiovascular Exercise Tolerance: Good negative cardio ROS Normal cardiovascular exam Rhythm:regular Rate:Normal     Neuro/Psych negative neurological ROS  negative psych ROS   GI/Hepatic negative GI ROS, Neg liver ROS,   Endo/Other  negative endocrine ROS  Renal/GU negative Renal ROS  negative genitourinary   Musculoskeletal   Abdominal   Peds  Hematology negative hematology ROS (+)   Anesthesia Other Findings Past Medical History: No date: Psoriasis Past Surgical History: No date: TONSILLECTOMY BMI    Body Mass Index: 22.89 kg/m     Reproductive/Obstetrics negative OB ROS                             Anesthesia Physical Anesthesia Plan  ASA: II  Anesthesia Plan: General ETT   Post-op Pain Management:    Induction:   PONV Risk Score and Plan:   Airway Management Planned:   Additional Equipment:   Intra-op Plan:   Post-operative Plan:   Informed Consent: I have reviewed the patients History and Physical, chart, labs and discussed the procedure including the risks, benefits and alternatives for the proposed anesthesia with the patient or authorized representative who has indicated his/her understanding and acceptance.     Dental Advisory Given  Plan Discussed with: CRNA  Anesthesia Plan Comments:         Anesthesia Quick Evaluation

## 2019-07-02 NOTE — Discharge Instructions (Addendum)
AMBULATORY SURGERY  DISCHARGE INSTRUCTIONS   1) The drugs that you were given will stay in your system until tomorrow so for the next 24 hours you should not:  A) Drive an automobile B) Make any legal decisions C) Drink any alcoholic beverage   2) You may resume regular meals tomorrow.  Today it is better to start with liquids and gradually work up to solid foods.  You may eat anything you prefer, but it is better to start with liquids, then soup and crackers, and gradually work up to solid foods.   3) Please notify your doctor immediately if you have any unusual bleeding, trouble breathing, redness and pain at the surgery site, drainage, fever, or pain not relieved by medication. 4)   5) Your post-operative visit with Dr.                                     is: Date:                        Time:    Please call to schedule your post-operative visit.  6) Additional Instructions:     Laparoscopic Cholecystectomy, Care After   These instructions give you information on caring for yourself after your procedure. Your doctor may also give you more specific instructions. Call your doctor if you have any problems or questions after your procedure.  HOME CARE  Change your bandages (dressings) as told by your doctor.  Keep the wound dry and clean. Wash the wound gently with soap and water. Pat the wound dry with a clean towel.  Do not take baths, swim, or use hot tubs for 2 weeks, or as told by your doctor.  Only take medicine as told by your doctor.  Eat a normal diet as told by your doctor.  Do not lift anything heavier than 10 pounds (4.5 kg) until your doctor says it is okay.  Do not play contact sports for 1 week, or as told by your doctor. GET HELP IF:  Your wound is red, puffy (swollen), or painful.  You have yellowish-white fluid (pus) coming from the wound.  You have fluid draining from the wound for more than 1 day.  You have a bad smell coming from the wound.  Your  wound breaks open. GET HELP RIGHT AWAY IF:  You have trouble breathing.  You have chest pain.  You have a fever >101  You have pain in the shoulders (shoulder strap areas) that is getting worse.  You feel dizzy or pass out (faint).  You have severe belly (abdominal) pain.  You feel sick to your stomach (nauseous) or throw up (vomit) for more than 1 day.   

## 2019-07-02 NOTE — Transfer of Care (Signed)
Immediate Anesthesia Transfer of Care Note  Patient: Forester Endrizzi  Procedure(s) Performed: XI ROBOTIC ASSISTED LAPAROSCOPIC CHOLECYSTECTOMY (N/A Abdomen)  Patient Location: PACU  Anesthesia Type:General  Level of Consciousness: drowsy and responds to stimulation  Airway & Oxygen Therapy: Patient Spontanous Breathing and Patient connected to face mask oxygen  Post-op Assessment: Report given to RN and Post -op Vital signs reviewed and stable  Post vital signs: Reviewed and stable  Last Vitals:  Vitals Value Taken Time  BP 119/68 07/02/19 1545  Temp 36 C 07/02/19 1545  Pulse 74 07/02/19 1547  Resp 13 07/02/19 1547  SpO2 100 % 07/02/19 1547  Vitals shown include unvalidated device data.  Last Pain:  Vitals:   07/02/19 1251  TempSrc: Temporal  PainSc: 2          Complications: No apparent anesthesia complications

## 2019-07-02 NOTE — Anesthesia Procedure Notes (Signed)
Procedure Name: Intubation Performed by: Kelton Pillar, CRNA Pre-anesthesia Checklist: Patient identified, Emergency Drugs available, Suction available and Patient being monitored Patient Re-evaluated:Patient Re-evaluated prior to induction Oxygen Delivery Method: Circle system utilized Preoxygenation: Pre-oxygenation with 100% oxygen Induction Type: IV induction Ventilation: Mask ventilation without difficulty Laryngoscope Size: McGraph and 3 Grade View: Grade I Tube type: Oral Number of attempts: 1 Airway Equipment and Method: Stylet Placement Confirmation: ETT inserted through vocal cords under direct vision,  positive ETCO2,  CO2 detector and breath sounds checked- equal and bilateral Secured at: 21 cm Tube secured with: Tape Dental Injury: Teeth and Oropharynx as per pre-operative assessment

## 2019-07-02 NOTE — Op Note (Signed)
Robotic assisted laparoscopic Cholecystectomy  Pre-operative Diagnosis: Chronic cholecystitis  Post-operative Diagnosis: same  Procedure:  Robotic assisted laparoscopic Cholecystectomy  Surgeon: Caroleen Hamman, MD FACS  Anesthesia: Gen. with endotracheal tube  Findings: Chronic mild Cholecystitis   Estimated Blood Loss: 5cc       Specimens: Gallbladder           Complications: none   Procedure Details  The patient was seen again in the Holding Room. The benefits, complications, treatment options, and expected outcomes were discussed with the patient. The risks of bleeding, infection, recurrence of symptoms, failure to resolve symptoms, bile duct damage, bile duct leak, retained common bile duct stone, bowel injury, any of which could require further surgery and/or ERCP, stent, or papillotomy were reviewed with the patient. The likelihood of improving the patient's symptoms with return to their baseline status is good.  The patient and/or family concurred with the proposed plan, giving informed consent.  The patient was taken to Operating Room, identified  and the procedure verified as Laparoscopic Cholecystectomy.  A Time Out was held and the above information confirmed.  Prior to the induction of general anesthesia, antibiotic prophylaxis was administered. VTE prophylaxis was in place. General endotracheal anesthesia was then administered and tolerated well. After the induction, the abdomen was prepped with Chloraprep and draped in the sterile fashion. The patient was positioned in the supine position.  Cut down technique was used to enter the abdominal cavity and a Hasson trochar was placed after two vicryl stitches were anchored to the fascia. Pneumoperitoneum was then created with CO2 and tolerated well without any adverse changes in the patient's vital signs.  Three 8-mm ports were placed under direct vision. All skin incisions  were infiltrated with a local anesthetic agent before  making the incision and placing the trocars.   The patient was positioned  in reverse Trendelenburg, robot was brought to the surgical field and docked in the standard fashion.  We made sure all the instrumentation was kept indirect view at all times and that there were no collision between the arms. I scrubbed out and went to the console.  The gallbladder was identified, the fundus grasped and retracted cephalad. Adhesions were lysed bluntly. The infundibulum was grasped and retracted laterally, exposing the peritoneum overlying the triangle of Calot. This was then divided and exposed in a blunt fashion. An extended critical view of the cystic duct and cystic artery was obtained.  The cystic duct was clearly identified and bluntly dissected.   Artery and duct were double clipped and divided. Using ICG cholangiography we visualized the cystic duct and no abnormal  biliary ductal anatomy or of bile injuries were observed. The gallbladder was taken from the gallbladder fossa in a retrograde fashion with the electrocautery.  Hemostasis was achieved with the electrocautery. nspection of the right upper quadrant was performed. No bleeding, bile duct injury or leak, or bowel injury was noted. Robotic instruments and robotic arms were undocked in the standard fashion.  I scrubbed back in.  The gallbladder was removed and placed in an Endocatch bag.   Pneumoperitoneum was released.  The periumbilical port site was closed with interrumpted 0 Vicryl sutures. 4-0 subcuticular Monocryl was used to close the skin. Dermabond was  applied.  The patient was then extubated and brought to the recovery room in stable condition. Sponge, lap, and needle counts were correct at closure and at the conclusion of the case.  Caroleen Hamman, MD, FACS

## 2019-07-03 NOTE — Anesthesia Postprocedure Evaluation (Signed)
Anesthesia Post Note  Patient: Jesse Clark  Procedure(s) Performed: XI ROBOTIC ASSISTED LAPAROSCOPIC CHOLECYSTECTOMY (N/A Abdomen)  Patient location during evaluation: PACU Anesthesia Type: General Level of consciousness: awake and alert and oriented Pain management: pain level controlled Vital Signs Assessment: post-procedure vital signs reviewed and stable Respiratory status: spontaneous breathing Cardiovascular status: blood pressure returned to baseline Anesthetic complications: no     Last Vitals:  Vitals:   07/02/19 1723 07/02/19 1743  BP: 122/75 108/66  Pulse: 74 88  Resp: 16 16  Temp: 36.6 C 36.6 C  SpO2: 100% 100%    Last Pain:  Vitals:   07/02/19 1743  TempSrc:   PainSc: 2                  Mariadejesus Cade

## 2019-07-06 ENCOUNTER — Telehealth: Payer: Self-pay | Admitting: *Deleted

## 2019-07-06 LAB — SURGICAL PATHOLOGY

## 2019-07-06 NOTE — Telephone Encounter (Signed)
Patient coming by today to pick up FMLA

## 2019-07-15 ENCOUNTER — Other Ambulatory Visit: Payer: Self-pay

## 2019-07-15 ENCOUNTER — Telehealth (INDEPENDENT_AMBULATORY_CARE_PROVIDER_SITE_OTHER): Payer: BC Managed Care – PPO | Admitting: Surgery

## 2019-07-15 ENCOUNTER — Encounter: Payer: Self-pay | Admitting: Surgery

## 2019-07-15 DIAGNOSIS — Z09 Encounter for follow-up examination after completed treatment for conditions other than malignant neoplasm: Secondary | ICD-10-CM

## 2019-07-15 NOTE — Progress Notes (Signed)
S/p rob cholecystectomy Doing very well  No fevers or chills, + PO He is in [place], [place] and did well w the trip Some discomfort Path d/w pt  A/p Doing well No heavy lifting RTC prn

## 2020-06-01 ENCOUNTER — Other Ambulatory Visit: Payer: Self-pay

## 2020-06-01 MED ORDER — PREDNISONE 20 MG PO TABS
ORAL_TABLET | ORAL | 0 refills | Status: DC
  Filled 2020-06-01: qty 22, 12d supply, fill #0

## 2020-06-02 ENCOUNTER — Other Ambulatory Visit: Payer: Self-pay

## 2020-09-07 ENCOUNTER — Other Ambulatory Visit: Payer: Self-pay

## 2020-09-07 MED ORDER — TRIAMCINOLONE ACETONIDE 0.1 % EX CREA
TOPICAL_CREAM | CUTANEOUS | 0 refills | Status: DC
  Filled 2020-09-07: qty 454, 30d supply, fill #0

## 2021-03-08 ENCOUNTER — Ambulatory Visit: Payer: 59 | Admitting: Internal Medicine

## 2021-03-08 ENCOUNTER — Encounter: Payer: Self-pay | Admitting: Internal Medicine

## 2021-03-08 ENCOUNTER — Other Ambulatory Visit: Payer: Self-pay

## 2021-03-08 VITALS — BP 117/78 | HR 71 | Temp 97.9°F | Ht 68.9 in | Wt 177.8 lb

## 2021-03-08 DIAGNOSIS — L409 Psoriasis, unspecified: Secondary | ICD-10-CM

## 2021-03-08 DIAGNOSIS — I480 Paroxysmal atrial fibrillation: Secondary | ICD-10-CM

## 2021-03-08 LAB — URINALYSIS, ROUTINE W REFLEX MICROSCOPIC
Bilirubin, UA: NEGATIVE
Glucose, UA: NEGATIVE
Ketones, UA: NEGATIVE
Leukocytes,UA: NEGATIVE
Nitrite, UA: NEGATIVE
Protein,UA: NEGATIVE
RBC, UA: NEGATIVE
Specific Gravity, UA: 1.015 (ref 1.005–1.030)
Urobilinogen, Ur: 0.2 mg/dL (ref 0.2–1.0)
pH, UA: 7 (ref 5.0–7.5)

## 2021-03-08 NOTE — Progress Notes (Signed)
BP 117/78    Pulse 71    Temp 97.9 F (36.6 C) (Oral)    Ht 5' 8.9" (1.75 m)    Wt 177 lb 12.8 oz (80.6 kg)    SpO2 100%    BMI 26.33 kg/m    Subjective:    Patient ID: Jesse Clark, male    DOB: 1985-05-13, 36 y.o.   MRN: [id number]  Chief Complaint  Patient presents with   New Patient (Initial Visit)                                                                                                                                                                                                                                                                                                                                                                                                                                                                                                                                    HPI: Jesse Clark  is a 36 y.o. male  Pt is here to establish care, he is a RT @ [facility] No medical problems. [place] in 2021 - cholecystectomy. Had Afib x RVR was prepping to cardiovert him  Ex smoker quit x 12 yrs. Age 58 - 4 smokes.  Seen cards had an ECHO x last year was inconclusive.  Has a ho psoriasis  Rash This is a chronic (bil elbows and forearms as well as abdominal wall multipel small sptos noted with scaly rash) problem. The problem has been waxing and waning since onset. The affected locations include the abdomen, left elbow and right elbow. The rash is characterized by scaling, itchiness and dryness. Pertinent negatives include no congestion, cough, diarrhea, eye pain, fatigue, fever, shortness of breath or vomiting.   Chief Complaint  Patient presents with   New Patient (Initial Visit)  Relevant past medical, surgical, family and social history reviewed and updated as indicated. Interim medical history since our last visit reviewed. Allergies and medications reviewed and updated.  Review of Systems  Constitutional:  Negative for activity change, appetite change, chills, fatigue and fever.  HENT:  Negative for congestion, ear discharge, ear pain and facial swelling.   Eyes:  Negative for pain, discharge and itching.  Respiratory:  Negative for cough, chest tightness, shortness of breath and wheezing.   Cardiovascular:  Negative for chest pain, palpitations and leg swelling.  Gastrointestinal:  Negative for abdominal distention, abdominal pain, blood in stool, constipation, diarrhea, nausea and vomiting.  Endocrine: Negative for cold intolerance, heat intolerance, polydipsia, polyphagia and polyuria.  Genitourinary:  Negative for difficulty urinating, dysuria, flank pain, frequency, hematuria and urgency.  Musculoskeletal:  Negative for arthralgias, gait problem, joint swelling and myalgias.  Skin:  Positive for rash. Negative for color change and wound.  Neurological:  Negative for dizziness, tremors, speech difficulty, weakness, light-headedness, numbness and headaches.  Hematological:  Does not bruise/bleed easily.  Psychiatric/Behavioral:  Negative for agitation, confusion, decreased concentration, sleep disturbance and suicidal ideas.    Per HPI unless specifically indicated above     Objective:    BP 117/78    Pulse 71    Temp 97.9 F (36.6 C) (Oral)    Ht 5' 8.9" (1.75 m)    Wt 177 lb 12.8 oz (80.6 kg)    SpO2 100%    BMI 26.33 kg/m   Wt Readings from Last 3 Encounters:  03/08/21 177 lb 12.8 oz (80.6 kg)  07/02/19 154 lb 15.7 oz (70.3  kg)  07/01/19 155 lb (70.3 kg)    Physical Exam Vitals and nursing note reviewed.  Constitutional:      General: He is not in acute distress.    Appearance: Normal appearance. He is not ill-appearing or diaphoretic.  HENT:     Head: Normocephalic and atraumatic.     Right Ear: Tympanic membrane and external ear normal. There is no impacted cerumen.     Left Ear: External ear normal.     Nose: No congestion or rhinorrhea.     Mouth/Throat:     Pharynx: No oropharyngeal exudate or posterior oropharyngeal erythema.  Eyes:     Conjunctiva/sclera: Conjunctivae normal.     Pupils: Pupils are equal, round, and reactive to light.  Cardiovascular:     Rate and Rhythm: Normal rate and regular rhythm.     Heart sounds: No murmur heard.   No friction rub. No gallop.  Pulmonary:     Effort: No respiratory distress.     Breath sounds: No stridor. No wheezing or rhonchi.  Chest:     Chest wall: No tenderness.  Abdominal:     General: Abdomen is flat. Bowel sounds are normal. There is no distension.     Palpations: Abdomen is soft. There is no mass.     Tenderness: There is no abdominal tenderness. There is no guarding.  Musculoskeletal:        General: No swelling or deformity.     Cervical back: Normal range of motion and neck supple. No rigidity or tenderness.     Right lower leg: No edema.     Left lower leg: No edema.  Skin:    General: Skin is warm and dry.     Coloration: Skin is not jaundiced.     Findings: Rash present. No erythema.     Comments: Multiple areas of macular/  plauqes noted with scaling on abdominal wall anteriorly and on the extensor surfaces of bil elbows and forearms.   Neurological:     Mental Status: He is alert and oriented to person, place, and time. Mental status is at baseline.  Psychiatric:        Mood and Affect: Mood normal.        Behavior: Behavior normal.        Thought Content: Thought content normal.        Judgment: Judgment normal.    Results  for orders placed or performed during the hospital encounter of 07/02/19  Surgical pathology  Result Value Ref Range   SURGICAL PATHOLOGY      SURGICAL PATHOLOGY CASE: [id number] PATIENT: Jesse Clark Surgical Pathology Report     Specimen Submitted: A. Gallbladder  Clinical History: Biliary colic     DIAGNOSIS: A. GALLBLADDER; CHOLECYSTECTOMY: - CHOLELITHIASIS, CHRONIC CHOLECYSTITIS, AND CHOLESTEROLOSIS. - NEGATIVE FOR DYSPLASIA AND MALIGNANCY.  GROSS DESCRIPTION: A. Labeled: Gallbladder Received: Formalin Size of specimen: 8.0 x 2.5 x 2.0 cm Specimen integrity: Intact External surface: Tan-purple, smooth, and finely vascular Wall thickness: 0.2 cm Mucosa: The mucosa displays diffuse minute yellow excrescences and is otherwise green and velvety.  No polyps or additional abnormalities are grossly identified. Cystic duct: 0.5 cm in length by 0.2 cm in diameter.  Patent.  No cystic duct lymph node is grossly identified. Bile present: Yes; green bile is present in the lumen. Stones present: Yes; 2 yellow nodular stones (2.3 x 2.0 x 1.1 cm in aggregate) are present in the lumen. Other fi ndings: None grossly identified  Block summary: 1 - cystic duct surgical resection margin (en face, inked blue) and gallbladder wall    Final Diagnosis performed by Bryan Lemma, MD.   Electronically signed 07/06/2019 4:33:11PM The electronic signature indicates that the named Attending Pathologist has evaluated the specimen Technical component performed at [facility], [address], [place], [place] [address] Lab: [phone] Dir: Rush Farmer, MD, MMM  Professional component performed at [facility], [facility], [place], [place], [place] [address] Lab: [phone] Dir: Dellia Nims. Rubinas, MD         Current Outpatient Medications:    Multiple Vitamin (MULTI-VITAMIN) tablet, Take 1 tablet by mouth daily. , Disp: , Rfl:    triamcinolone cream  (KENALOG) 0.1 %, Apply 1-2 times a day to affected areas as needed until smooth, repeat if needed. Avoid on face and skin folds., Disp: 454 g, Rfl: 0   Cholecalciferol 50 MCG (2000 UT) TABS, Take 2,000 Units by mouth daily. , Disp: , Rfl:     Assessment & Plan:  Afib / RVR :  Stable.  Check TSH  ECHo wnl done in 5/22 Fu and mx per cards  CHADS 2 score 0  Not on anticoagulants.  2check labs today will check CMP, FLP, CBC,TSH, UA  3. Psoriasis  Will refer to derm . Pt has been managing this at home uptil now.     Problem List Items Addressed This Visit   None Visit Diagnoses     Paroxysmal A-fib ([place])    -  Primary   Relevant Orders   CBC with Differential/Platelet   Comprehensive metabolic panel   Lipid panel   Urinalysis, Routine w reflex microscopic   TSH   Psoriasis       Relevant Orders   Ambulatory referral to Dermatology        Orders Placed This Encounter  Procedures   CBC with  Differential/Platelet   Comprehensive metabolic panel   Lipid panel   Urinalysis, Routine w reflex microscopic   TSH   Ambulatory referral to Dermatology     No orders of the defined types were placed in this encounter.    Follow up plan: Return in about 1 year (around 03/08/2022).

## 2021-03-09 LAB — CBC WITH DIFFERENTIAL/PLATELET
Basophils Absolute: 0 10*3/uL (ref 0.0–0.2)
Basos: 1 %
EOS (ABSOLUTE): 0.1 10*3/uL (ref 0.0–0.4)
Eos: 2 %
Hematocrit: 47.5 % (ref 37.5–51.0)
Hemoglobin: 16.3 g/dL (ref 13.0–17.7)
Immature Grans (Abs): 0 10*3/uL (ref 0.0–0.1)
Immature Granulocytes: 0 %
Lymphocytes Absolute: 1.5 10*3/uL (ref 0.7–3.1)
Lymphs: 30 %
MCH: 29.1 pg (ref 26.6–33.0)
MCHC: 34.3 g/dL (ref 31.5–35.7)
MCV: 85 fL (ref 79–97)
Monocytes Absolute: 0.4 10*3/uL (ref 0.1–0.9)
Monocytes: 8 %
Neutrophils Absolute: 3 10*3/uL (ref 1.4–7.0)
Neutrophils: 59 %
Platelets: 228 10*3/uL (ref 150–450)
RBC: 5.61 x10E6/uL (ref 4.14–5.80)
RDW: 13.3 % (ref 11.6–15.4)
WBC: 5.1 10*3/uL (ref 3.4–10.8)

## 2021-03-09 LAB — COMPREHENSIVE METABOLIC PANEL
ALT: 22 IU/L (ref 0–44)
AST: 24 IU/L (ref 0–40)
Albumin/Globulin Ratio: 2.1 (ref 1.2–2.2)
Albumin: 4.9 g/dL (ref 4.0–5.0)
Alkaline Phosphatase: 66 IU/L (ref 44–121)
BUN/Creatinine Ratio: 17 (ref 9–20)
BUN: 12 mg/dL (ref 6–20)
Bilirubin Total: 0.5 mg/dL (ref 0.0–1.2)
CO2: 25 mmol/L (ref 20–29)
Calcium: 9.7 mg/dL (ref 8.7–10.2)
Chloride: 102 mmol/L (ref 96–106)
Creatinine, Ser: 0.71 mg/dL — ABNORMAL LOW (ref 0.76–1.27)
Globulin, Total: 2.3 g/dL (ref 1.5–4.5)
Glucose: 76 mg/dL (ref 70–99)
Potassium: 4.7 mmol/L (ref 3.5–5.2)
Sodium: 143 mmol/L (ref 134–144)
Total Protein: 7.2 g/dL (ref 6.0–8.5)
eGFR: 124 mL/min/{1.73_m2} (ref >59)

## 2021-03-09 LAB — TSH: TSH: 0.724 u[IU]/mL (ref 0.450–4.500)

## 2021-04-25 ENCOUNTER — Other Ambulatory Visit: Payer: Self-pay

## 2021-06-26 ENCOUNTER — Ambulatory Visit: Payer: 59 | Admitting: Dermatology

## 2021-06-26 DIAGNOSIS — Z79899 Other long term (current) drug therapy: Secondary | ICD-10-CM | POA: Diagnosis not present

## 2021-06-26 DIAGNOSIS — L409 Psoriasis, unspecified: Secondary | ICD-10-CM

## 2021-06-26 DIAGNOSIS — L7211 Pilar cyst: Secondary | ICD-10-CM | POA: Diagnosis not present

## 2021-06-26 NOTE — Patient Instructions (Signed)
Side effects of Otezla (apremilast) include diarrhea, nausea, headache, upper respiratory infection, depression, and weight decrease (5-10%). It should only be taken by pregnant women after a discussion regarding risks and benefits with their doctor. Goal is control of skin condition, not cure.  The use of Rutherford Nail requires long term medication management, including periodic office visits. ? ? ? ? ?If You Need Anything After Your Visit ? ?If you have any questions or concerns for your doctor, please call our main line at [phone] and press option 4 to reach your doctor's medical assistant. If no one answers, please leave a voicemail as directed and we will return your call as soon as possible. Messages left after 4 pm will be answered the following business day.  ? ?You may also send [place] a message via MyChart. We typically respond to MyChart messages within 1-2 business days. ? ?For prescription refills, please ask your pharmacy to contact our office. Our fax number is [phone]. ? ?If you have an urgent issue when the clinic is closed that cannot wait until the next business day, you can page your doctor at the number below.   ? ?Please note that while we do our best to be available for urgent issues outside of office hours, we are not available 24/7.  ? ?If you have an urgent issue and are unable to reach [place], you may choose to seek medical care at your doctor's office, retail clinic, urgent care center, or emergency room. ? ?If you have a medical emergency, please immediately call 911 or go to the emergency department. ? ?Pager Numbers ? ?- Dr. Nehemiah Massed: [phone] ? ?- Dr. Laurence Ferrari: [phone] ? ?- Dr. Nicole Kindred: [phone] ? ?In the event of inclement weather, please call our main line at [phone] for an update on the status of any delays or closures. ? ?Dermatology Medication Tips: ?Please keep the boxes that topical medications come in in order to help keep track of the instructions about where and how  to use these. Pharmacies typically print the medication instructions only on the boxes and not directly on the medication tubes.  ? ?If your medication is too expensive, please contact our office at [phone] option 4 or send [place] a message through [place].  ? ?We are unable to tell what your co-pay for medications will be in advance as this is different depending on your insurance coverage. However, we may be able to find a substitute medication at lower cost or fill out paperwork to get insurance to cover a needed medication.  ? ?If a prior authorization is required to get your medication covered by your insurance company, please allow [place] 1-2 business days to complete this process. ? ?Drug prices often vary depending on where the prescription is filled and some pharmacies may offer cheaper prices. ? ?The website www.goodrx.com contains coupons for medications through different pharmacies. The prices here do not account for what the cost may be with help from insurance (it may be cheaper with your insurance), but the website can give you the price if you did not use any insurance.  ?- You can print the associated coupon and take it with your prescription to the pharmacy.  ?- You may also stop by our office during regular business hours and pick up a GoodRx coupon card.  ?- If you need your prescription sent electronically to a different pharmacy, notify our office through [facility] or by phone at [phone] option 4. ? ? ? ? ?Si [place]  Algo Despu?s de Su Visita ? ?Tambi?n puede enviarnos un mensaje a trav?s de MyChart. Por lo general respondemos a los mensajes de MyChart en el transcurso de 1 a 2 d?as h?biles. ? ?Para renovar recetas, por favor pida a su farmacia que se ponga en contacto con nuestra oficina. Nuestro n?mero de fax es el [phone]. ? ?Si tiene un asunto urgente cuando la cl?nica est? cerrada y que no puede esperar hasta el siguiente d?a h?bil, puede llamar/localizar a su  doctor(a) al n?mero que aparece a continuaci?n.  ? ?Por favor, tenga en cuenta que aunque hacemos todo lo posible para estar disponibles para asuntos urgentes fuera del horario de oficina, no estamos disponibles las 24 horas del d?a, los 7 d?as de la semana.  ? ?Si tiene un problema urgente y no puede comunicarse con nosotros, puede optar por buscar atenci?n m?dica  en el consultorio de su doctor(a), en una cl?nica privada, en un centro de atenci?n urgente o en una sala de emergencias. ? ?Si tiene Engineer, maintenance (IT) m?dica, por favor llame inmediatamente al 911 o vaya a la sala de emergencias. ? ?N?meros de b?per ? ?- Dr. Nehemiah Massed: [phone] ? ?- Dra. Moye: [phone] ? ?- Dra. Nicole Kindred: [phone] ? ?En caso de inclemencias del tiempo, por favor llame a nuestra l?nea principal al [phone] para una actualizaci?n sobre el estado de cualquier retraso o cierre. ? ?Consejos para la medicaci?n en dermatolog?a: ?Por favor, guarde las cajas en las que vienen los medicamentos de uso t?pico para ayudarle a seguir las instrucciones sobre d?nde y c?mo usarlos. Las farmacias generalmente imprimen las instrucciones del medicamento s?lo en las cajas y no directamente en los tubos del [place].  ? ?Si su medicamento es muy caro, por favor, p?ngase en contacto con Zigmund Daniel llamando al [phone] y presione la opci?n 4 o env?enos un mensaje a trav?s de MyChart.  ? ?No podemos decirle cu?l ser? su copago por los medicamentos por adelantado ya que esto es diferente dependiendo de la cobertura de su seguro. Sin embargo, es posible que podamos encontrar un medicamento sustituto a Electrical engineer un formulario para que el seguro cubra el medicamento que se considera necesario.  ? ?Si se requiere Ardelia Mems autorizaci?n previa para que su compa??a de seguros [place] su medicamento, por favor perm?tanos de 1 a 2 d?as h?biles para completar este proceso. ? ?Los precios de los medicamentos var?an con frecuencia dependiendo del  Environmental consultant de d?nde se surte la receta y alguna farmacias pueden ofrecer precios m?s baratos. ? ?El sitio web www.goodrx.com tiene cupones para medicamentos de Airline pilot. Los precios aqu? no tienen en cuenta lo que podr?a costar con la ayuda del seguro (puede ser m?s barato con su seguro), pero el sitio web puede darle el precio si no utiliz? ning?n seguro.  ?- Puede imprimir el cup?n correspondiente y llevarlo con su receta a la farmacia.  ?- Tambi?n puede pasar por nuestra oficina durante el horario de atenci?n regular y recoger una tarjeta de cupones de GoodRx.  ?- Si necesita que su receta se env?e electr?nicamente a Chiropodist, informe a nuestra oficina a trav?s de MyChart de [place] o por tel?fono llamando al [phone] y presione la opci?n 4.  ?

## 2021-06-26 NOTE — Progress Notes (Signed)
? ?  New Patient Visit ? ?Subjective  ?Jesse Clark is a 36 y.o. male who presents for the following: Psoriasis (Chest, arm, back and legs - uses Amlactin. Has tried topical steroids in the past. 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 of scalp). ? ?The following portions of the chart were reviewed this encounter and updated as appropriate:  ? Tobacco  Allergies  Meds  Problems  Med Hx  Surg Hx  Fam Hx   ?  ?Review of Systems:  No other skin or systemic complaints except as noted in HPI or Assessment and Plan. ? ?Objective  ?Well appearing patient in no apparent distress; mood and affect are within normal limits. ? ?A focused examination was performed including scalp, trunk, extremities. Relevant physical exam findings are noted in the Assessment and Plan. ? ? ? ? ? ? ? ? ? ? ? ? ? ? ? ?Left scalp ?Subcutaneous nodule at scalp 1.0 cm ? ? ?Assessment & Plan  ?Psoriasis ?Severe with BSA of 20% ?Psoriasis is a chronic non-curable, but treatable genetic/hereditary disease that may have other systemic features affecting other organ systems such as joints (Psoriatic Arthritis). It is associated with an increased risk of inflammatory bowel disease, heart disease, non-alcoholic fatty liver disease, and depression.   ? ?Start Otezla and titrate up to 30 mg qd - samples packs given today ? ?Side effects of Otezla (apremilast) include diarrhea, nausea, headache, upper respiratory infection, depression, and weight decrease (5-10%). It should only be taken by pregnant women after a discussion regarding risks and benefits with their doctor. Goal is control of skin condition, not cure.  The use of Rutherford Nail requires long term medication management, including periodic office visits. ? ? ?Pilar cyst ?Left scalp ?Benign-appearing. Exam most consistent with an epidermal inclusion cyst. Discussed that a cyst is a benign growth that  can grow over time and sometimes get irritated or inflamed. Recommend observation if it is not bothersome. Discussed option of surgical excision to remove it if it is growing, symptomatic, or other changes noted. Please call for new or changing lesions so they can be evaluated. ? ?Will plan excision ? ?Return for 4-6 weeks , Psoriasis - Surgery for cyst of scalp. ? ?I, Ashok Cordia, CMA, am acting as scribe for Sarina Ser, MD . ?Documentation: I have reviewed the above documentation for accuracy and completeness, and I agree with the above. ? ?Sarina Ser, MD ? ?

## 2021-07-04 ENCOUNTER — Encounter: Payer: Self-pay | Admitting: Dermatology

## 2021-08-07 ENCOUNTER — Encounter: Payer: Self-pay | Admitting: Dermatology

## 2021-08-07 ENCOUNTER — Ambulatory Visit: Payer: 59 | Admitting: Dermatology

## 2021-08-07 ENCOUNTER — Other Ambulatory Visit (HOSPITAL_COMMUNITY): Payer: Self-pay

## 2021-08-07 DIAGNOSIS — Z79899 Other long term (current) drug therapy: Secondary | ICD-10-CM

## 2021-08-07 DIAGNOSIS — L409 Psoriasis, unspecified: Secondary | ICD-10-CM | POA: Diagnosis not present

## 2021-08-07 MED ORDER — OTEZLA 30 MG PO TABS: 30.0000 mg | ORAL_TABLET | Freq: Two times a day (BID) | ORAL | 5 refills | Status: DC

## 2021-08-07 MED ORDER — OTEZLA 30 MG PO TABS
30.0000 mg | ORAL_TABLET | Freq: Two times a day (BID) | ORAL | 5 refills | Status: DC
  Filled 2021-08-07: qty 60, 30d supply, fill #0

## 2021-08-07 NOTE — Patient Instructions (Signed)
Side effects of Otezla (apremilast) include diarrhea, nausea, headache, upper respiratory infection, depression, and weight decrease (5-10%). It should only be taken by pregnant women after a discussion regarding risks and benefits with their doctor. Goal is control of skin condition, not cure.  The use of Otezla requires long term medication management, including periodic office visits.   Due to recent changes in healthcare laws, you may see results of your pathology and/or laboratory studies on MyChart before the doctors have had a chance to review them. We understand that in some cases there may be results that are confusing or concerning to you. Please understand that not all results are received at the same time and often the doctors may need to interpret multiple results in order to provide you with the best plan of care or course of treatment. Therefore, we ask that you please give us 2 business days to thoroughly review all your results before contacting the office for clarification. Should we see a critical lab result, you will be contacted sooner.   If You Need Anything After Your Visit  If you have any questions or concerns for your doctor, please call our main line at [phone] and press option 4 to reach your doctor's medical assistant. If no one answers, please leave a voicemail as directed and we will return your call as soon as possible. Messages left after 4 pm will be answered the following business day.   You may also send us a message via MyChart. We typically respond to MyChart messages within 1-2 business days.  For prescription refills, please ask your pharmacy to contact our office. Our fax number is [phone].  If you have an urgent issue when the clinic is closed that cannot wait until the next business day, you can page your doctor at the number below.    Please note that while we do our best to be available for urgent issues outside of office hours, we are not  available 24/7.   If you have an urgent issue and are unable to reach us, you may choose to seek medical care at your doctor's office, retail clinic, urgent care center, or emergency room.  If you have a medical emergency, please immediately call 911 or go to the emergency department.  Pager Numbers  - Dr. Kowalski: [phone]  - Dr. Moye: [phone]  - Dr. Stewart: [phone]  In the event of inclement weather, please call our main line at [phone] for an update on the status of any delays or closures.  Dermatology Medication Tips: Please keep the boxes that topical medications come in in order to help keep track of the instructions about where and how to use these. Pharmacies typically print the medication instructions only on the boxes and not directly on the medication tubes.   If your medication is too expensive, please contact our office at [phone] option 4 or send us a message through MyChart.   We are unable to tell what your co-pay for medications will be in advance as this is different depending on your insurance coverage. However, we may be able to find a substitute medication at lower cost or fill out paperwork to get insurance to cover a needed medication.   If a prior authorization is required to get your medication covered by your insurance company, please allow us 1-2 business days to complete this process.  Drug prices often vary depending on where the prescription is filled and some pharmacies may offer cheaper prices.  The   website www.goodrx.com contains coupons for medications through different pharmacies. The prices here do not account for what the cost may be with help from insurance (it may be cheaper with your insurance), but the website can give you the price if you did not use any insurance.  - You can print the associated coupon and take it with your prescription to the pharmacy.  - You may also stop by our office during regular business hours  and pick up a GoodRx coupon card.  - If you need your prescription sent electronically to a different pharmacy, notify our office through [facility] MyChart or by phone at [phone] option 4.     Si Usted Necesita Algo Despus de Su Visita  Tambin puede enviarnos un mensaje a travs de MyChart. Por lo general respondemos a los mensajes de MyChart en el transcurso de 1 a 2 das hbiles.  Para renovar recetas, por favor pida a su farmacia que se ponga en contacto con nuestra oficina. Nuestro nmero de fax es el [phone].  Si tiene un asunto urgente cuando la clnica est cerrada y que no puede esperar hasta el siguiente da hbil, puede llamar/localizar a su doctor(a) al nmero que aparece a continuacin.   Por favor, tenga en cuenta que aunque hacemos todo lo posible para estar disponibles para asuntos urgentes fuera del horario de oficina, no estamos disponibles las 24 horas del da, los 7 das de la semana.   Si tiene un problema urgente y no puede comunicarse con nosotros, puede optar por buscar atencin mdica  en el consultorio de su doctor(a), en una clnica privada, en un centro de atencin urgente o en una sala de emergencias.  Si tiene una emergencia mdica, por favor llame inmediatamente al 911 o vaya a la sala de emergencias.  Nmeros de bper  - Dr. Kowalski: [phone]  - Dra. Moye: [phone]  - Dra. Stewart: [phone]  En caso de inclemencias del tiempo, por favor llame a nuestra lnea principal al [phone] para una actualizacin sobre el estado de cualquier retraso o cierre.  Consejos para la medicacin en dermatologa: Por favor, guarde las cajas en las que vienen los medicamentos de uso tpico para ayudarle a seguir las instrucciones sobre dnde y cmo usarlos. Las farmacias generalmente imprimen las instrucciones del medicamento slo en las cajas y no directamente en los tubos del medicamento.   Si su medicamento es muy caro, por favor, pngase  en contacto con nuestra oficina llamando al [phone] y presione la opcin 4 o envenos un mensaje a travs de MyChart.   No podemos decirle cul ser su copago por los medicamentos por adelantado ya que esto es diferente dependiendo de la cobertura de su seguro. Sin embargo, es posible que podamos encontrar un medicamento sustituto a menor costo o llenar un formulario para que el seguro cubra el medicamento que se considera necesario.   Si se requiere una autorizacin previa para que su compaa de seguros cubra su medicamento, por favor permtanos de 1 a 2 das hbiles para completar este proceso.  Los precios de los medicamentos varan con frecuencia dependiendo del lugar de dnde se surte la receta y alguna farmacias pueden ofrecer precios ms baratos.  El sitio web www.goodrx.com tiene cupones para medicamentos de diferentes farmacias. Los precios aqu no tienen en cuenta lo que podra costar con la ayuda del seguro (puede ser ms barato con su seguro), pero el sitio web puede darle el precio si no utiliz ningn seguro.  - Puede   imprimir el cupn correspondiente y llevarlo con su receta a la farmacia.  - Tambin puede pasar por nuestra oficina durante el horario de atencin regular y recoger una tarjeta de cupones de GoodRx.  - Si necesita que su receta se enve electrnicamente a una farmacia diferente, informe a nuestra oficina a travs de MyChart de [facility] o por telfono llamando al [phone] y presione la opcin 4.  

## 2021-08-07 NOTE — Progress Notes (Signed)
   Follow-Up Visit   Subjective  Jesse Clark is a 36 y.o. male who presents for the following: Psoriasis (Patient has been on [place] '30mg'$  po QD and experienced some h/a at first which has since resolved. No other s/e per pt. Psoriasis has improved slightly. ).  The following portions of the chart were reviewed this encounter and updated as appropriate:   Tobacco  Allergies  Meds  Problems  Med Hx  Surg Hx  Fam Hx     Review of Systems:  No other skin or systemic complaints except as noted in HPI or Assessment and Plan.  Objective  Well appearing patient in no apparent distress; mood and affect are within normal limits.  A focused examination was performed including the face, trunk, extremities. Relevant physical exam findings are noted in the Assessment and Plan.  Trunk, extremities Guttate plaques scattered on the trunk and extremities. Mild improvement compared to photos.    Assessment & Plan  Psoriasis Trunk, extremities  Mild improvement on exam -   Psoriasis is a chronic non-curable, but treatable genetic/hereditary disease that may have other systemic features affecting other organ systems such as joints (Psoriatic Arthritis). It is associated with an increased risk of inflammatory bowel disease, heart disease, non-alcoholic fatty liver disease, and depression.  Increase Otezla to '30mg'$  po BID. Side effects of Otezla (apremilast) include diarrhea, nausea, headache, upper respiratory infection, depression, and weight decrease (5-10%). It should only be taken by pregnant women after a discussion regarding risks and benefits with their doctor. Goal is control of skin condition, not cure.  The use of [place] Nail requires long term medication management, including periodic office visits. Sample packs given to patient today.   Consider Sotyktu in the future.     Related Medications Apremilast (OTEZLA) 30 MG TABS Take 1 tablet (30 mg total) by mouth 2 (two) times  daily.  Return in about 2 months (around 10/07/2021) for psoriasis follow up .  Luther Redo, CMA, am acting as scribe for Sarina Ser, MD . Documentation: I have reviewed the above documentation for accuracy and completeness, and I agree with the above.  Sarina Ser, MD

## 2021-08-14 ENCOUNTER — Other Ambulatory Visit: Payer: Self-pay

## 2021-08-14 ENCOUNTER — Other Ambulatory Visit (HOSPITAL_COMMUNITY): Payer: Self-pay

## 2021-08-14 DIAGNOSIS — L409 Psoriasis, unspecified: Secondary | ICD-10-CM

## 2021-08-14 MED ORDER — OTEZLA 30 MG PO TABS: 30.0000 mg | ORAL_TABLET | Freq: Two times a day (BID) | ORAL | 5 refills | Status: DC

## 2021-08-14 NOTE — Progress Notes (Signed)
[facility] called requesting Jesse Clark to their pharmacy.   New RX e-scripted over. aw

## 2021-08-17 ENCOUNTER — Other Ambulatory Visit (HOSPITAL_COMMUNITY): Payer: Self-pay

## 2021-08-17 MED ORDER — APREMILAST 30 MG PO TABS
ORAL_TABLET | ORAL | 5 refills | Status: DC
  Filled 2021-08-17: qty 60, 30d supply, fill #0
  Filled 2021-12-04: qty 60, 30d supply, fill #1

## 2021-08-18 ENCOUNTER — Other Ambulatory Visit (HOSPITAL_COMMUNITY): Payer: Self-pay

## 2021-09-05 ENCOUNTER — Ambulatory Visit (INDEPENDENT_AMBULATORY_CARE_PROVIDER_SITE_OTHER): Payer: 59 | Admitting: Dermatology

## 2021-09-05 ENCOUNTER — Other Ambulatory Visit: Payer: Self-pay

## 2021-09-05 DIAGNOSIS — L7211 Pilar cyst: Secondary | ICD-10-CM | POA: Diagnosis not present

## 2021-09-05 DIAGNOSIS — L409 Psoriasis, unspecified: Secondary | ICD-10-CM

## 2021-09-05 DIAGNOSIS — D485 Neoplasm of uncertain behavior of skin: Secondary | ICD-10-CM

## 2021-09-05 MED ORDER — MUPIROCIN 2 % EX OINT
1.0000 | TOPICAL_OINTMENT | Freq: Every day | CUTANEOUS | 0 refills | Status: DC
  Filled 2021-09-05: qty 22, 7d supply, fill #0

## 2021-09-05 NOTE — Patient Instructions (Signed)

## 2021-09-05 NOTE — Progress Notes (Signed)
   Follow-Up Visit   Subjective  Jesse Clark is a 36 y.o. male who presents for the following: Cyst (Vs other of left scalp - excise today).  The following portions of the chart were reviewed this encounter and updated as appropriate:   Tobacco  Allergies  Meds  Problems  Med Hx  Surg Hx  Fam Hx     Review of Systems:  No other skin or systemic complaints except as noted in HPI or Assessment and Plan.  Objective  Well appearing patient in no apparent distress; mood and affect are within normal limits.  A focused examination was performed including scalp. Relevant physical exam findings are noted in the Assessment and Plan.  Left scalp Cystic papule   Assessment & Plan  Neoplasm of uncertain behavior of skin Left scalp Skin excision  Lesion length (cm):  1.3 Lesion width (cm):  1.3 Margin per side (cm):  0 Total excision diameter (cm):  1.3 Informed consent: discussed and consent obtained   Timeout: patient name, date of birth, surgical site, and procedure verified   Procedure prep:  Patient was prepped and draped in usual sterile fashion Prep type:  Isopropyl alcohol and povidone-iodine Anesthesia: the lesion was anesthetized in a standard fashion   Anesthetic:  1% lidocaine w/ epinephrine 1-100,000 buffered w/ 8.4% NaHCO3 Instrument used: #15 blade   Hemostasis achieved with: pressure   Outcome: patient tolerated procedure well with no complications   Post-procedure details: sterile dressing applied and wound care instructions given   Dressing type: bandage and pressure dressing (mupirocin)    Skin repair Complexity:  Complex Final length (cm):  1.3 Reason for type of repair: reduce tension to allow closure, reduce the risk of dehiscence, infection, and necrosis, reduce subcutaneous dead space and avoid a hematoma, allow closure of the large defect, preserve normal anatomy, preserve normal anatomical and functional relationships and enhance both functionality  and cosmetic results   Undermining comment:  Undermining defect 1.3 cm Subcutaneous layers (deep stitches):  Subcutaneous suture technique: inverted dermal. Fine/surface layer approximation (top stitches):  Suture size:  4-0 Suture type: nylon   Stitches: simple running   Suture removal (days):  7 Hemostasis achieved with: suture and pressure Outcome: patient tolerated procedure well with no complications   Post-procedure details: sterile dressing applied and wound care instructions given   Dressing type: bandage and pressure dressing (mupirocin)    mupirocin ointment (BACTROBAN) 2 % Apply 1 Application topically daily. With dressing changes  Specimen 1 - Surgical pathology Differential Diagnosis: Cyst vs other  Check Margins: No  Start Mupirocin oint qd to excision site  Psoriasis Psoriasis is a chronic non-curable, but treatable genetic/hereditary disease that may have other systemic features affecting other organ systems such as joints (Psoriatic Arthritis). It is associated with an increased risk of inflammatory bowel disease, heart disease, non-alcoholic fatty liver disease, and depression.    Continue Otezla 30 mg 1 po bid - keep August 2023 follow up appointment  Related Medications Apremilast (OTEZLA) 30 MG TABS Take 1 tablet (30 mg total) by mouth 2 (two) times daily.  Return in about 1 week (around 09/12/2021) for suture removal.  I, Ashok Cordia, CMA, am acting as scribe for Sarina Ser, MD . Documentation: I have reviewed the above documentation for accuracy and completeness, and I agree with the above.  Sarina Ser, MD

## 2021-09-06 ENCOUNTER — Telehealth: Payer: Self-pay

## 2021-09-06 NOTE — Telephone Encounter (Signed)
Left pt msg to call if any problems after yesterday's surgery./sh 

## 2021-09-12 ENCOUNTER — Other Ambulatory Visit (HOSPITAL_COMMUNITY): Payer: Self-pay

## 2021-09-12 ENCOUNTER — Ambulatory Visit (INDEPENDENT_AMBULATORY_CARE_PROVIDER_SITE_OTHER): Payer: 59 | Admitting: Dermatology

## 2021-09-12 DIAGNOSIS — L7211 Pilar cyst: Secondary | ICD-10-CM

## 2021-09-12 DIAGNOSIS — L409 Psoriasis, unspecified: Secondary | ICD-10-CM

## 2021-09-12 DIAGNOSIS — Z79899 Other long term (current) drug therapy: Secondary | ICD-10-CM

## 2021-09-12 NOTE — Patient Instructions (Signed)
Side effects of Otezla (apremilast) include diarrhea, nausea, headache, upper respiratory infection, depression, and weight decrease (5-10%). It should only be taken by pregnant women after a discussion regarding risks and benefits with their doctor. Goal is control of skin condition, not cure.  The use of Otezla requires long term medication management, including periodic office visits.   Due to recent changes in healthcare laws, you may see results of your pathology and/or laboratory studies on MyChart before the doctors have had a chance to review them. We understand that in some cases there may be results that are confusing or concerning to you. Please understand that not all results are received at the same time and often the doctors may need to interpret multiple results in order to provide you with the best plan of care or course of treatment. Therefore, we ask that you please give us 2 business days to thoroughly review all your results before contacting the office for clarification. Should we see a critical lab result, you will be contacted sooner.   If You Need Anything After Your Visit  If you have any questions or concerns for your doctor, please call our main line at [phone] and press option 4 to reach your doctor's medical assistant. If no one answers, please leave a voicemail as directed and we will return your call as soon as possible. Messages left after 4 pm will be answered the following business day.   You may also send us a message via MyChart. We typically respond to MyChart messages within 1-2 business days.  For prescription refills, please ask your pharmacy to contact our office. Our fax number is [phone].  If you have an urgent issue when the clinic is closed that cannot wait until the next business day, you can page your doctor at the number below.    Please note that while we do our best to be available for urgent issues outside of office hours, we are not  available 24/7.   If you have an urgent issue and are unable to reach us, you may choose to seek medical care at your doctor's office, retail clinic, urgent care center, or emergency room.  If you have a medical emergency, please immediately call 911 or go to the emergency department.  Pager Numbers  - Dr. Kowalski: [phone]  - Dr. Moye: [phone]  - Dr. Stewart: [phone]  In the event of inclement weather, please call our main line at [phone] for an update on the status of any delays or closures.  Dermatology Medication Tips: Please keep the boxes that topical medications come in in order to help keep track of the instructions about where and how to use these. Pharmacies typically print the medication instructions only on the boxes and not directly on the medication tubes.   If your medication is too expensive, please contact our office at [phone] option 4 or send us a message through MyChart.   We are unable to tell what your co-pay for medications will be in advance as this is different depending on your insurance coverage. However, we may be able to find a substitute medication at lower cost or fill out paperwork to get insurance to cover a needed medication.   If a prior authorization is required to get your medication covered by your insurance company, please allow us 1-2 business days to complete this process.  Drug prices often vary depending on where the prescription is filled and some pharmacies may offer cheaper prices.  The   website www.goodrx.com contains coupons for medications through different pharmacies. The prices here do not account for what the cost may be with help from insurance (it may be cheaper with your insurance), but the website can give you the price if you did not use any insurance.  - You can print the associated coupon and take it with your prescription to the pharmacy.  - You may also stop by our office during regular business hours  and pick up a GoodRx coupon card.  - If you need your prescription sent electronically to a different pharmacy, notify our office through [facility] MyChart or by phone at [phone] option 4.     Si Usted Necesita Algo Despus de Su Visita  Tambin puede enviarnos un mensaje a travs de MyChart. Por lo general respondemos a los mensajes de MyChart en el transcurso de 1 a 2 das hbiles.  Para renovar recetas, por favor pida a su farmacia que se ponga en contacto con nuestra oficina. Nuestro nmero de fax es el [phone].  Si tiene un asunto urgente cuando la clnica est cerrada y que no puede esperar hasta el siguiente da hbil, puede llamar/localizar a su doctor(a) al nmero que aparece a continuacin.   Por favor, tenga en cuenta que aunque hacemos todo lo posible para estar disponibles para asuntos urgentes fuera del horario de oficina, no estamos disponibles las 24 horas del da, los 7 das de la semana.   Si tiene un problema urgente y no puede comunicarse con nosotros, puede optar por buscar atencin mdica  en el consultorio de su doctor(a), en una clnica privada, en un centro de atencin urgente o en una sala de emergencias.  Si tiene una emergencia mdica, por favor llame inmediatamente al 911 o vaya a la sala de emergencias.  Nmeros de bper  - Dr. Kowalski: [phone]  - Dra. Moye: [phone]  - Dra. Stewart: [phone]  En caso de inclemencias del tiempo, por favor llame a nuestra lnea principal al [phone] para una actualizacin sobre el estado de cualquier retraso o cierre.  Consejos para la medicacin en dermatologa: Por favor, guarde las cajas en las que vienen los medicamentos de uso tpico para ayudarle a seguir las instrucciones sobre dnde y cmo usarlos. Las farmacias generalmente imprimen las instrucciones del medicamento slo en las cajas y no directamente en los tubos del medicamento.   Si su medicamento es muy caro, por favor, pngase  en contacto con nuestra oficina llamando al [phone] y presione la opcin 4 o envenos un mensaje a travs de MyChart.   No podemos decirle cul ser su copago por los medicamentos por adelantado ya que esto es diferente dependiendo de la cobertura de su seguro. Sin embargo, es posible que podamos encontrar un medicamento sustituto a menor costo o llenar un formulario para que el seguro cubra el medicamento que se considera necesario.   Si se requiere una autorizacin previa para que su compaa de seguros cubra su medicamento, por favor permtanos de 1 a 2 das hbiles para completar este proceso.  Los precios de los medicamentos varan con frecuencia dependiendo del lugar de dnde se surte la receta y alguna farmacias pueden ofrecer precios ms baratos.  El sitio web www.goodrx.com tiene cupones para medicamentos de diferentes farmacias. Los precios aqu no tienen en cuenta lo que podra costar con la ayuda del seguro (puede ser ms barato con su seguro), pero el sitio web puede darle el precio si no utiliz ningn seguro.  - Puede   imprimir el cupn correspondiente y llevarlo con su receta a la farmacia.  - Tambin puede pasar por nuestra oficina durante el horario de atencin regular y recoger una tarjeta de cupones de GoodRx.  - Si necesita que su receta se enve electrnicamente a una farmacia diferente, informe a nuestra oficina a travs de MyChart de [facility] o por telfono llamando al [phone] y presione la opcin 4.  

## 2021-09-12 NOTE — Progress Notes (Signed)
   Follow-Up Visit   Subjective  Jesse Clark is a 36 y.o. male who presents for the following: Post op/suture removal (Pathology proven benign cyst of the L scalp - pt is here today for suture removal) and Psoriasis (Pt came off of Otezla '30mg'$  po BID due to insomnia and chronic headaches. ).  The following portions of the chart were reviewed this encounter and updated as appropriate:   Tobacco  Allergies  Meds  Problems  Med Hx  Surg Hx  Fam Hx     Review of Systems:  No other skin or systemic complaints except as noted in HPI or Assessment and Plan.  Objective  Well appearing patient in no apparent distress; mood and affect are within normal limits.  A focused examination was performed including the face and scalp. Relevant physical exam findings are noted in the Assessment and Plan.  Scalp Healing excision site.   Assessment & Plan  Psoriasis Trunk, extremities  Psoriasis is a chronic non-curable, but treatable genetic/hereditary disease that may have other systemic features affecting other organ systems such as joints (Psoriatic Arthritis). It is associated with an increased risk of inflammatory bowel disease, heart disease, non-alcoholic fatty liver disease, and depression.    Restart Otezla '30mg'$  po QD. Do not increase to BID due to h/a and insomnia. Side effects of Otezla (apremilast) include diarrhea, nausea, headache, upper respiratory infection, depression, and weight decrease (5-10%). It should only be taken by pregnant women after a discussion regarding risks and benefits with their doctor. Goal is control of skin condition, not cure.  The use of [place] Nail requires long term medication management, including periodic office visits.  Sample pack given to titrate back up to QD.  Related Medications Apremilast (OTEZLA) 30 MG TABS Take 1 tablet (30 mg total) by mouth 2 (two) times daily.  Pilar cyst Scalp  Encounter for Removal of Sutures - Incision site at the L  scalp is clean, dry and intact - Wound cleansed, sutures removed, wound cleansed and steri strips applied.  - Discussed pathology results showing benign pilar cyst  - Patient advised to keep steri-strips dry until they fall off. - Scars remodel for a full year. - Once steri-strips fall off, patient can apply over-the-counter silicone scar cream each night to help with scar remodeling if desired. - Patient advised to call with any concerns or if they notice any new or changing lesions.   Return for psoriasis follow up in 2-3 mths.  Luther Redo, CMA, am acting as scribe for Sarina Ser, MD . Documentation: I have reviewed the above documentation for accuracy and completeness, and I agree with the above.  Sarina Ser, MD

## 2021-09-13 ENCOUNTER — Encounter: Payer: Self-pay | Admitting: Dermatology

## 2021-09-22 ENCOUNTER — Encounter: Payer: Self-pay | Admitting: Dermatology

## 2021-09-26 ENCOUNTER — Other Ambulatory Visit: Payer: Self-pay

## 2021-09-26 ENCOUNTER — Telehealth: Payer: 59 | Admitting: Physician Assistant

## 2021-09-26 DIAGNOSIS — B9689 Other specified bacterial agents as the cause of diseases classified elsewhere: Secondary | ICD-10-CM | POA: Diagnosis not present

## 2021-09-26 DIAGNOSIS — J208 Acute bronchitis due to other specified organisms: Secondary | ICD-10-CM

## 2021-09-26 MED ORDER — AZITHROMYCIN 250 MG PO TABS
ORAL_TABLET | ORAL | 0 refills | Status: AC
  Filled 2021-09-26: qty 6, 5d supply, fill #0

## 2021-09-26 MED ORDER — ALBUTEROL SULFATE HFA 108 (90 BASE) MCG/ACT IN AERS
2.0000 | INHALATION_SPRAY | Freq: Four times a day (QID) | RESPIRATORY_TRACT | 0 refills | Status: DC | PRN
  Filled 2021-09-26: qty 8.5, 25d supply, fill #0

## 2021-09-26 MED ORDER — BENZONATATE 100 MG PO CAPS
100.0000 mg | ORAL_CAPSULE | Freq: Three times a day (TID) | ORAL | 0 refills | Status: DC | PRN
  Filled 2021-09-26: qty 30, 10d supply, fill #0

## 2021-09-26 NOTE — Progress Notes (Signed)
I have spent 5 minutes in review of e-visit questionnaire, review and updating patient chart, medical decision making and response to patient.   Adelis Docter Cody Dynesha Woolen, PA-C    

## 2021-09-26 NOTE — Progress Notes (Signed)
We are sorry that you are not feeling well.  Here is how we plan to help!  Based on your presentation I believe you most likely have A cough due to bacteria.  When patients have a fever and a productive cough with a change in color or increased sputum production, we are concerned about bacterial bronchitis.  If left untreated it can progress to pneumonia.  If your symptoms do not improve with your treatment plan it is important that you contact your provider.   I have prescribed Azithromyin 250 mg: two tablets now and then one tablet daily for 4 additonal days    In addition you may use A prescription cough medication called Tessalon Perles '100mg'$ . You may take 1-2 capsules every 8 hours as needed for your cough. I have also sent in an albuterol inhaler for you to use as directed, if needed, for tightness and wheezing.   From your responses in the eVisit questionnaire you describe inflammation in the upper respiratory tract which is causing a significant cough.  This is commonly called Bronchitis and has four common causes:   Allergies Viral Infections Acid Reflux Bacterial Infection Allergies, viruses and acid reflux are treated by controlling symptoms or eliminating the cause. An example might be a cough caused by taking certain blood pressure medications. You stop the cough by changing the medication. Another example might be a cough caused by acid reflux. Controlling the reflux helps control the cough.  USE OF BRONCHODILATOR ("RESCUE") INHALERS: There is a risk from using your bronchodilator too frequently.  The risk is that over-reliance on a medication which only relaxes the muscles surrounding the breathing tubes can reduce the effectiveness of medications prescribed to reduce swelling and congestion of the tubes themselves.  Although you feel brief relief from the bronchodilator inhaler, your asthma may actually be worsening with the tubes becoming more swollen and filled with mucus.  This can  delay other crucial treatments, such as oral steroid medications. If you need to use a bronchodilator inhaler daily, several times per day, you should discuss this with your provider.  There are probably better treatments that could be used to keep your asthma under control.     HOME CARE Only take medications as instructed by your medical team. Complete the entire course of an antibiotic. Drink plenty of fluids and get plenty of rest. Avoid close contacts especially the very young and the elderly Cover your mouth if you cough or cough into your sleeve. Always remember to wash your hands A steam or ultrasonic humidifier can help congestion.   GET HELP RIGHT AWAY IF: You develop worsening fever. You become short of breath You cough up blood. Your symptoms persist after you have completed your treatment plan MAKE SURE YOU  Understand these instructions. Will watch your condition. Will get help right away if you are not doing well or get worse.    Thank you for choosing an e-visit.  Your e-visit answers were reviewed by a board certified advanced clinical practitioner to complete your personal care plan. Depending upon the condition, your plan could have included both over the counter or prescription medications.  Please review your pharmacy choice. Make sure the pharmacy is open so you can pick up prescription now. If there is a problem, you may contact your provider through [employer] and have the prescription routed to another pharmacy.  Your safety is important to [place]. If you have drug allergies check your prescription carefully.   For the  next 24 hours you can use MyChart to ask questions about today's visit, request a non-urgent call back, or ask for a work or school excuse. You will get an email in the next two days asking about your experience. I hope that your e-visit has been valuable and will speed your recovery.

## 2021-09-28 ENCOUNTER — Other Ambulatory Visit (HOSPITAL_COMMUNITY): Payer: Self-pay

## 2021-10-02 ENCOUNTER — Other Ambulatory Visit (HOSPITAL_COMMUNITY): Payer: Self-pay

## 2021-10-17 ENCOUNTER — Ambulatory Visit: Payer: 59 | Admitting: Dermatology

## 2021-10-18 ENCOUNTER — Ambulatory Visit: Payer: 59 | Admitting: Dermatology

## 2021-11-17 ENCOUNTER — Other Ambulatory Visit (HOSPITAL_COMMUNITY): Payer: Self-pay

## 2021-11-20 ENCOUNTER — Other Ambulatory Visit (HOSPITAL_COMMUNITY): Payer: Self-pay

## 2021-12-04 ENCOUNTER — Other Ambulatory Visit (HOSPITAL_COMMUNITY): Payer: Self-pay

## 2021-12-07 ENCOUNTER — Other Ambulatory Visit (HOSPITAL_COMMUNITY): Payer: Self-pay

## 2021-12-12 ENCOUNTER — Other Ambulatory Visit (HOSPITAL_COMMUNITY): Payer: Self-pay

## 2021-12-13 ENCOUNTER — Ambulatory Visit: Payer: 59 | Admitting: Dermatology

## 2021-12-13 ENCOUNTER — Telehealth: Payer: Self-pay | Admitting: Pharmacist

## 2021-12-13 NOTE — Telephone Encounter (Signed)
Called patient to schedule an appointment for the [facility]. I was unable to reach the patient so I left a HIPAA-compliant message requesting that the patient return my call.   Luke Van Ausdall, PharmD, BCACP, CPP Clinical Pharmacist [facility] [phone]  

## 2021-12-20 ENCOUNTER — Telehealth: Payer: Self-pay | Admitting: Internal Medicine

## 2021-12-20 ENCOUNTER — Other Ambulatory Visit (HOSPITAL_COMMUNITY): Payer: Self-pay

## 2021-12-20 ENCOUNTER — Other Ambulatory Visit (HOSPITAL_BASED_OUTPATIENT_CLINIC_OR_DEPARTMENT_OTHER): Payer: Self-pay

## 2021-12-20 ENCOUNTER — Ambulatory Visit: Payer: 59 | Admitting: Dermatology

## 2021-12-20 ENCOUNTER — Ambulatory Visit: Payer: 59 | Attending: Internal Medicine | Admitting: Pharmacist

## 2021-12-20 DIAGNOSIS — Z79899 Other long term (current) drug therapy: Secondary | ICD-10-CM

## 2021-12-20 DIAGNOSIS — Z7189 Other specified counseling: Secondary | ICD-10-CM

## 2021-12-20 DIAGNOSIS — L409 Psoriasis, unspecified: Secondary | ICD-10-CM | POA: Diagnosis not present

## 2021-12-20 MED ORDER — VTAMA 1 % EX CREA
TOPICAL_CREAM | CUTANEOUS | 5 refills | Status: DC
  Filled 2021-12-20: qty 60, 30d supply, fill #0

## 2021-12-20 MED ORDER — APREMILAST 30 MG PO TABS
ORAL_TABLET | ORAL | 3 refills | Status: DC
  Filled 2021-12-20: qty 60, fill #0

## 2021-12-20 NOTE — Progress Notes (Signed)
  S: Patient presents for review of their specialty medication therapy.  Patient is currently taking Otezla for psoriasis. Patient is managed by Dr. Bea Laura for this.   Adherence: reported. He was off of the medication for ~1 month before resuming therapy in June of this year.   Efficacy: works well for him  Dosing:  Active psoriatic arthritis or plaque psoriasis (moderate to severe): Oral: patient was increased to BID dosing but this caused side effects. He now takes once daily with no issues and good efficacy.    Current adverse effects: Headache: none GI upset: none Weight loss: none Neuropsychiatric effects: none  O:     Lab Results  Component Value Date   WBC 5.1 03/08/2021   HGB 16.3 03/08/2021   HCT 47.5 03/08/2021   MCV 85 03/08/2021   PLT 228 03/08/2021      Chemistry      Component Value Date/Time   NA 143 03/08/2021 0953   K 4.7 03/08/2021 0953   CL 102 03/08/2021 0953   CO2 25 03/08/2021 0953   BUN 12 03/08/2021 0953   CREATININE 0.71 (L) 03/08/2021 0953      Component Value Date/Time   CALCIUM 9.7 03/08/2021 0953   ALKPHOS 66 03/08/2021 0953   AST 24 03/08/2021 0953   ALT 22 03/08/2021 0953   BILITOT 0.5 03/08/2021 0953       A/P: 1. Medication review: patient is currently on [place] for psoriasis and is tolerating it well. Reviewed the medication including the following: apremilast inhibits phosphodiesterase 4 (PDE4) specific for cyclic adenosine monophosphate (cAMP) which results in increased intracellular cAMP levels and regulation of numerous inflammatory mediators (eg, decreased expression of nitric oxide synthase, TNF-alpha, and interleukin [IL]-23, as well as increased IL-10. Patient educated on purpose, proper use and potential adverse effects of Otezla. Possible adverse effects include weight loss, GI upset, headache, and mood changes. Renal function should be routinely monitored. Administer without regard to food. Do not crush, chew, or  split tablets. No recommendations for any changes at this time.   Benard Halsted, PharmD, [facility] [place], [place] [phone]

## 2021-12-20 NOTE — Telephone Encounter (Signed)
Copied from [place] [id number]. Topic: General - Other >> Dec 20, 2021  9:47 AM Cyndi Bender wrote: Reason for CRM: Pt returned call to [facility] Attempted to transfer pt to the office but there was no answer. Cb# [phone]

## 2021-12-20 NOTE — Patient Instructions (Signed)
Due to recent changes in healthcare laws, you may see results of your pathology and/or laboratory studies on MyChart before the doctors have had a chance to review them. We understand that in some cases there may be results that are confusing or concerning to you. Please understand that not all results are received at the same time and often the doctors may need to interpret multiple results in order to provide you with the best plan of care or course of treatment. Therefore, we ask that you please give us 2 business days to thoroughly review all your results before contacting the office for clarification. Should we see a critical lab result, you will be contacted sooner.   If You Need Anything After Your Visit  If you have any questions or concerns for your doctor, please call our main line at [phone] and press option 4 to reach your doctor's medical assistant. If no one answers, please leave a voicemail as directed and we will return your call as soon as possible. Messages left after 4 pm will be answered the following business day.   You may also send us a message via MyChart. We typically respond to MyChart messages within 1-2 business days.  For prescription refills, please ask your pharmacy to contact our office. Our fax number is [phone].  If you have an urgent issue when the clinic is closed that cannot wait until the next business day, you can page your doctor at the number below.    Please note that while we do our best to be available for urgent issues outside of office hours, we are not available 24/7.   If you have an urgent issue and are unable to reach us, you may choose to seek medical care at your doctor's office, retail clinic, urgent care center, or emergency room.  If you have a medical emergency, please immediately call 911 or go to the emergency department.  Pager Numbers  - Dr. Kowalski: [phone]  - Dr. Moye: [phone]  - Dr. Stewart:  [phone]  In the event of inclement weather, please call our main line at [phone] for an update on the status of any delays or closures.  Dermatology Medication Tips: Please keep the boxes that topical medications come in in order to help keep track of the instructions about where and how to use these. Pharmacies typically print the medication instructions only on the boxes and not directly on the medication tubes.   If your medication is too expensive, please contact our office at [phone] option 4 or send us a message through MyChart.   We are unable to tell what your co-pay for medications will be in advance as this is different depending on your insurance coverage. However, we may be able to find a substitute medication at lower cost or fill out paperwork to get insurance to cover a needed medication.   If a prior authorization is required to get your medication covered by your insurance company, please allow us 1-2 business days to complete this process.  Drug prices often vary depending on where the prescription is filled and some pharmacies may offer cheaper prices.  The website www.goodrx.com contains coupons for medications through different pharmacies. The prices here do not account for what the cost may be with help from insurance (it may be cheaper with your insurance), but the website can give you the price if you did not use any insurance.  - You can print the associated coupon and take it with   your prescription to the pharmacy.  - You may also stop by our office during regular business hours and pick up a GoodRx coupon card.  - If you need your prescription sent electronically to a different pharmacy, notify our office through [facility] MyChart or by phone at [phone] option 4.     Si Usted Necesita Algo Despus de Su Visita  Tambin puede enviarnos un mensaje a travs de MyChart. Por lo general respondemos a los mensajes de MyChart en el transcurso de 1 a 2  das hbiles.  Para renovar recetas, por favor pida a su farmacia que se ponga en contacto con nuestra oficina. Nuestro nmero de fax es el [phone].  Si tiene un asunto urgente cuando la clnica est cerrada y que no puede esperar hasta el siguiente da hbil, puede llamar/localizar a su doctor(a) al nmero que aparece a continuacin.   Por favor, tenga en cuenta que aunque hacemos todo lo posible para estar disponibles para asuntos urgentes fuera del horario de oficina, no estamos disponibles las 24 horas del da, los 7 das de la semana.   Si tiene un problema urgente y no puede comunicarse con nosotros, puede optar por buscar atencin mdica  en el consultorio de su doctor(a), en una clnica privada, en un centro de atencin urgente o en una sala de emergencias.  Si tiene una emergencia mdica, por favor llame inmediatamente al 911 o vaya a la sala de emergencias.  Nmeros de bper  - Dr. Kowalski: [phone]  - Dra. Moye: [phone]  - Dra. Stewart: [phone]  En caso de inclemencias del tiempo, por favor llame a nuestra lnea principal al [phone] para una actualizacin sobre el estado de cualquier retraso o cierre.  Consejos para la medicacin en dermatologa: Por favor, guarde las cajas en las que vienen los medicamentos de uso tpico para ayudarle a seguir las instrucciones sobre dnde y cmo usarlos. Las farmacias generalmente imprimen las instrucciones del medicamento slo en las cajas y no directamente en los tubos del medicamento.   Si su medicamento es muy caro, por favor, pngase en contacto con nuestra oficina llamando al [phone] y presione la opcin 4 o envenos un mensaje a travs de MyChart.   No podemos decirle cul ser su copago por los medicamentos por adelantado ya que esto es diferente dependiendo de la cobertura de su seguro. Sin embargo, es posible que podamos encontrar un medicamento sustituto a menor costo o llenar un formulario para que el  seguro cubra el medicamento que se considera necesario.   Si se requiere una autorizacin previa para que su compaa de seguros cubra su medicamento, por favor permtanos de 1 a 2 das hbiles para completar este proceso.  Los precios de los medicamentos varan con frecuencia dependiendo del lugar de dnde se surte la receta y alguna farmacias pueden ofrecer precios ms baratos.  El sitio web www.goodrx.com tiene cupones para medicamentos de diferentes farmacias. Los precios aqu no tienen en cuenta lo que podra costar con la ayuda del seguro (puede ser ms barato con su seguro), pero el sitio web puede darle el precio si no utiliz ningn seguro.  - Puede imprimir el cupn correspondiente y llevarlo con su receta a la farmacia.  - Tambin puede pasar por nuestra oficina durante el horario de atencin regular y recoger una tarjeta de cupones de GoodRx.  - Si necesita que su receta se enve electrnicamente a una farmacia diferente, informe a nuestra oficina a travs de MyChart de [place]   o por telfono llamando al [phone] y presione la opcin 4.  

## 2021-12-20 NOTE — Telephone Encounter (Signed)
Call returned to patient. He is one of my [facility] specialty patients. Will schedule and document findings appropriately in a separate encounter.

## 2021-12-20 NOTE — Progress Notes (Signed)
   Follow-Up Visit   Subjective  Jesse Clark is a 36 y.o. male who presents for the following: Psoriasis (Patient currently using Otezla '30mg'$  po QD and tolerating medication well with no s/e. He also uses OTC Amlactin moisturizer. ). He could not tolerate higher dose of the Otezla medication due to headache and insomnia but does well on 30 mg daily  The following portions of the chart were reviewed this encounter and updated as appropriate:   Tobacco  Allergies  Meds  Problems  Med Hx  Surg Hx  Fam Hx     Review of Systems:  No other skin or systemic complaints except as noted in HPI or Assessment and Plan.  Objective  Well appearing patient in no apparent distress; mood and affect are within normal limits.  A focused examination was performed including the face, trunk, extremities. Relevant physical exam findings are noted in the Assessment and Plan.  Trunk, extremities Well-demarcated erythematous papules/plaques with silvery scale, guttate pink scaly papules.    Assessment & Plan  Psoriasis Trunk, extremities  Chronic and persistent condition with duration or expected duration over one year. Condition is symptomatic / bothersome to patient. Not to goal, but doing much better.  He could not tolerate higher dose of the Otezla medication due to headache and insomnia but does well on 30 mg daily  Psoriasis is a chronic non-curable, but treatable genetic/hereditary disease that may have other systemic features affecting other organ systems such as joints (Psoriatic Arthritis). It is associated with an increased risk of inflammatory bowel disease, heart disease, non-alcoholic fatty liver disease, and depression.    Continue Otezla '30mg'$  po QD. Side effects of Otezla (apremilast) include diarrhea, nausea, headache, upper respiratory infection, depression, and weight decrease (5-10%). It should only be taken by pregnant women after a discussion regarding risks and benefits with  their doctor. Goal is control of skin condition, not cure.  The use of [place] requires long term medication management, including periodic office visits.  Discussed Sotyktu possible s/e and the need for monitoring labs.   Start Vtama cream apply to aa's QHS.   Tap[place] (VTAMA) 1 % CREA - Trunk, extremities Apply to affected areas of psoriasis daily as needed.  Related Medications Apremilast (OTEZLA) 30 MG TABS Take 1 tablet (30 mg total) by mouth 2 (two) times daily.   Return in about 6 months (around 06/21/2022) for psoriasis follow up .  Luther Redo, CMA, am acting as scribe for Sarina Ser, MD . Documentation: I have reviewed the above documentation for accuracy and completeness, and I agree with the above.  Sarina Ser, MD

## 2021-12-25 ENCOUNTER — Encounter: Payer: Self-pay | Admitting: Dermatology

## 2021-12-25 ENCOUNTER — Other Ambulatory Visit (HOSPITAL_COMMUNITY): Payer: Self-pay

## 2021-12-27 ENCOUNTER — Other Ambulatory Visit (HOSPITAL_COMMUNITY): Payer: Self-pay

## 2022-01-05 ENCOUNTER — Other Ambulatory Visit (HOSPITAL_COMMUNITY): Payer: Self-pay

## 2022-01-15 ENCOUNTER — Other Ambulatory Visit (HOSPITAL_COMMUNITY): Payer: Self-pay

## 2022-01-19 ENCOUNTER — Other Ambulatory Visit (HOSPITAL_COMMUNITY): Payer: Self-pay

## 2022-01-20 ENCOUNTER — Other Ambulatory Visit (HOSPITAL_COMMUNITY): Payer: Self-pay

## 2022-01-22 ENCOUNTER — Other Ambulatory Visit (HOSPITAL_COMMUNITY): Payer: Self-pay

## 2022-01-23 ENCOUNTER — Other Ambulatory Visit (HOSPITAL_COMMUNITY): Payer: Self-pay

## 2022-01-31 ENCOUNTER — Other Ambulatory Visit (HOSPITAL_COMMUNITY): Payer: Self-pay

## 2022-03-04 IMAGING — US US ABDOMEN LIMITED
1 series · 14 of 25 positions shown · non-contrast
Comparison: None.

CLINICAL DATA: Right upper quadrant pain and nausea.

EXAM:
ULTRASOUND ABDOMEN LIMITED RIGHT UPPER QUADRANT

[Series 1: us abdomen limited · 0.19mm/px · 14 of 39 slices shown]
[im 1/39]
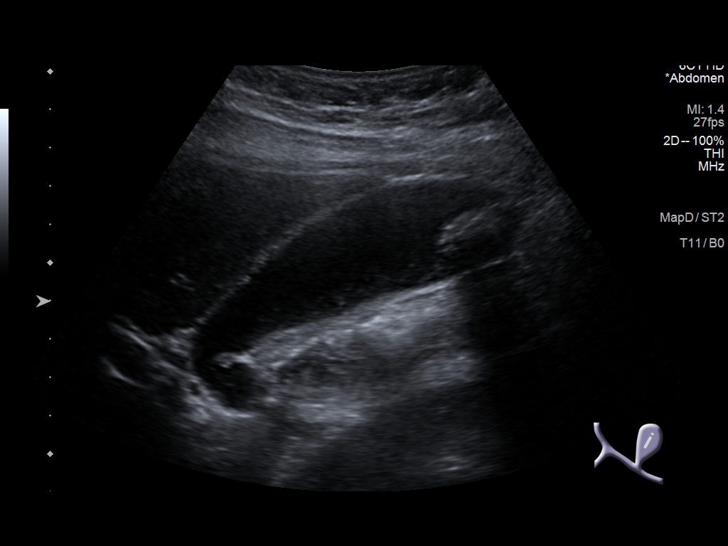
[im 4/39]
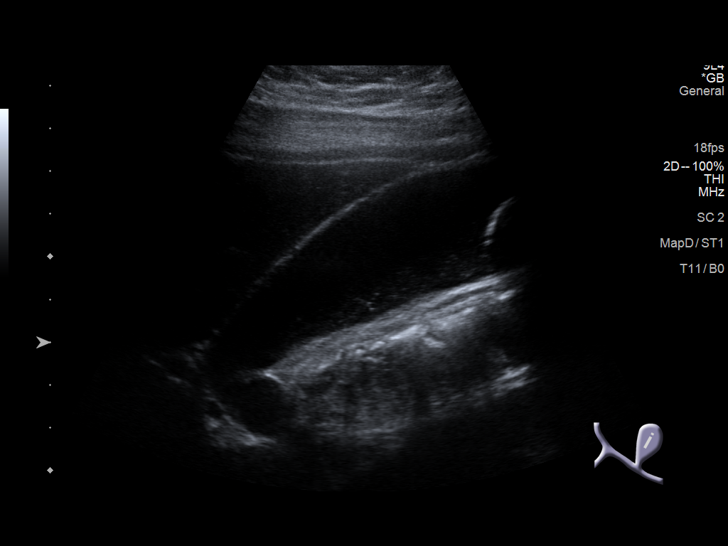
[im 7/39]
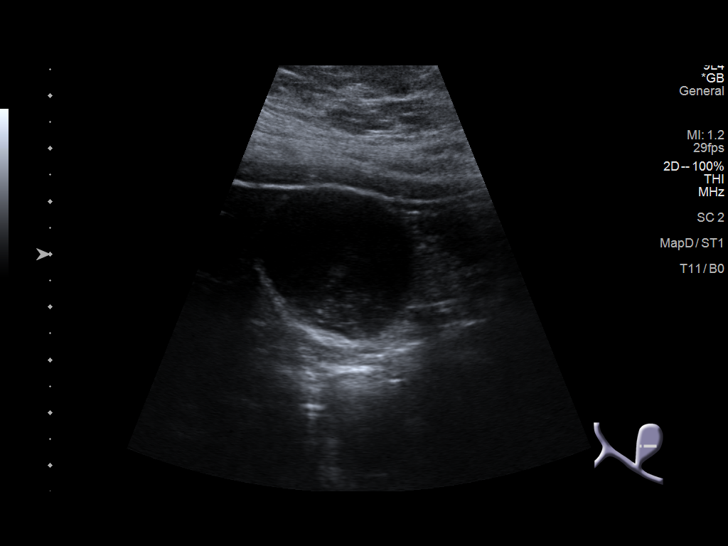
[im 10/39]
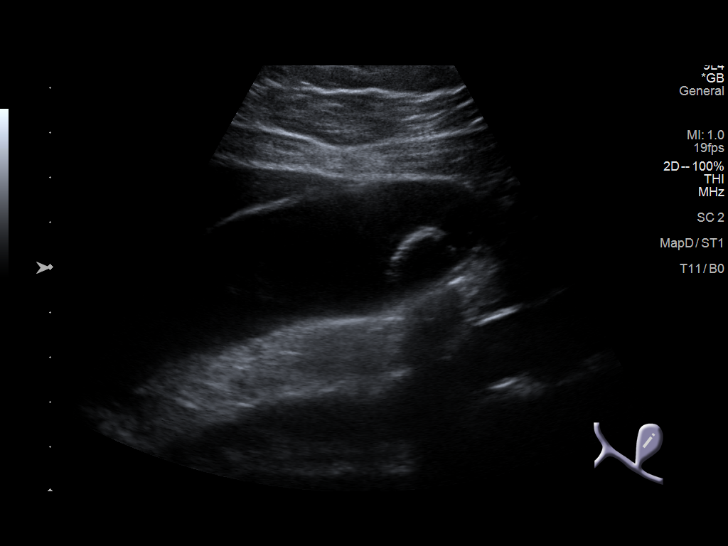
[im 13/39]
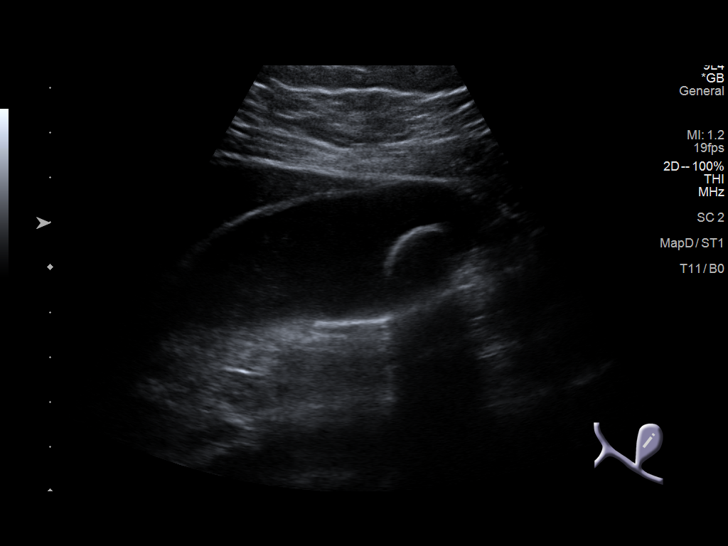
[im 15/39]
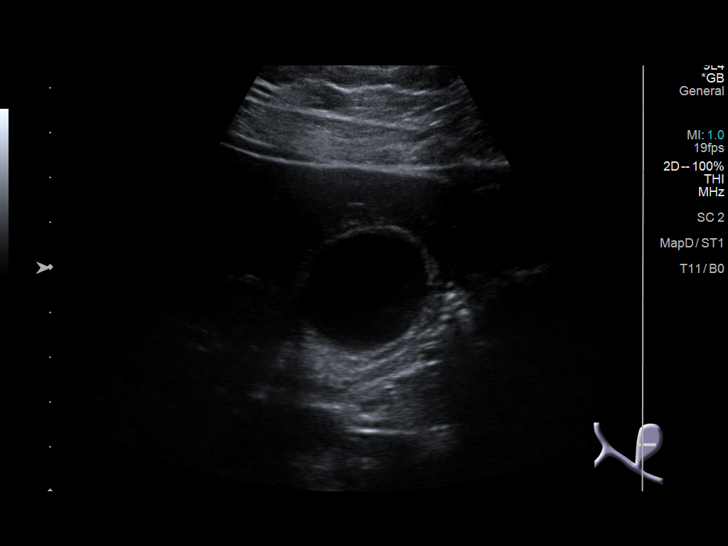
[im 18/39]
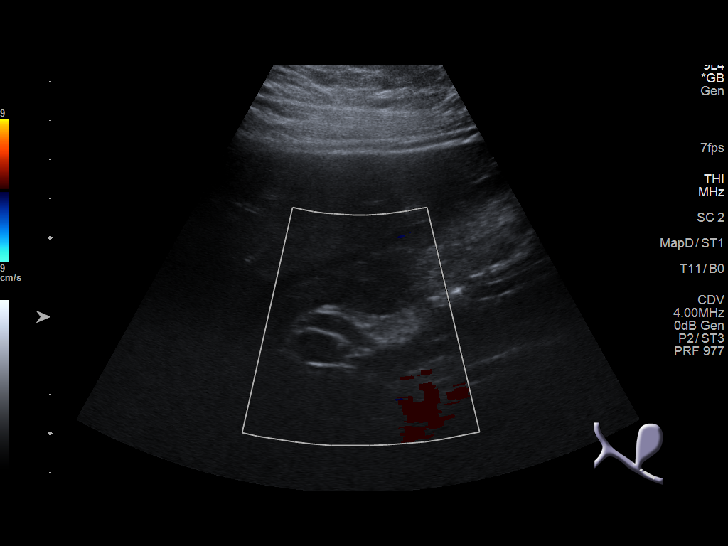
[im 21/39]
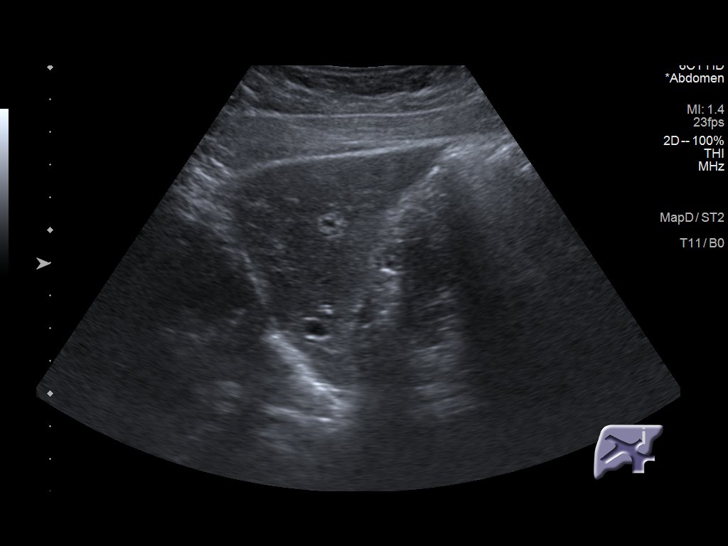
[im 24/39]
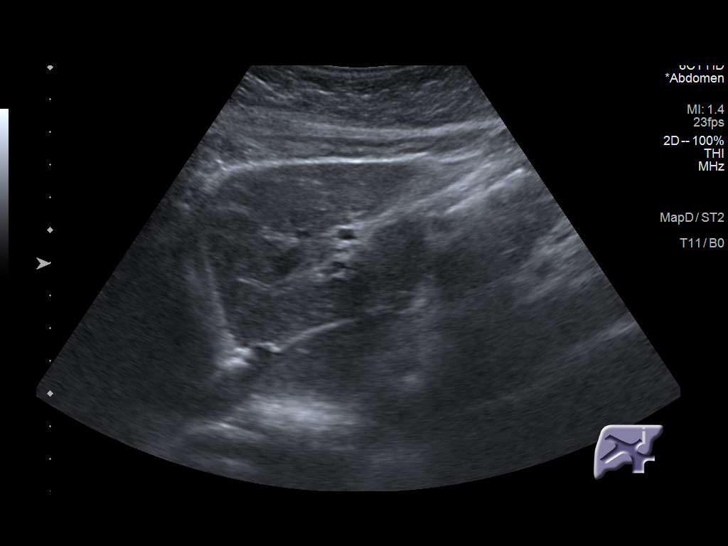
[im 26/39]
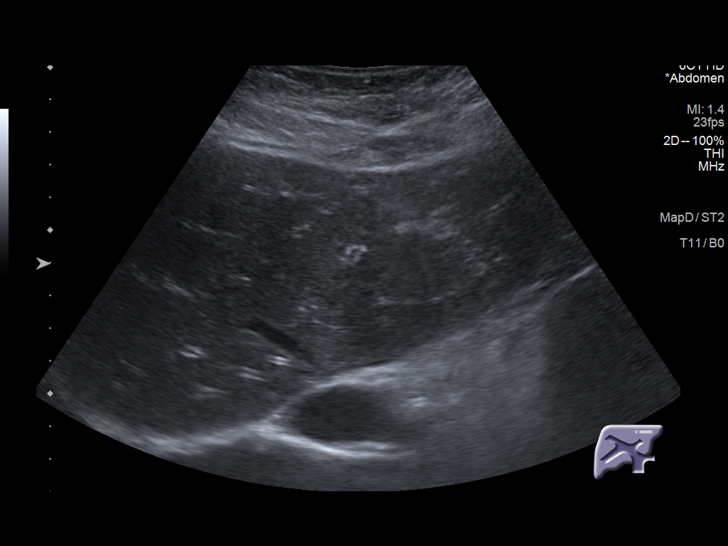
[im 29/39]
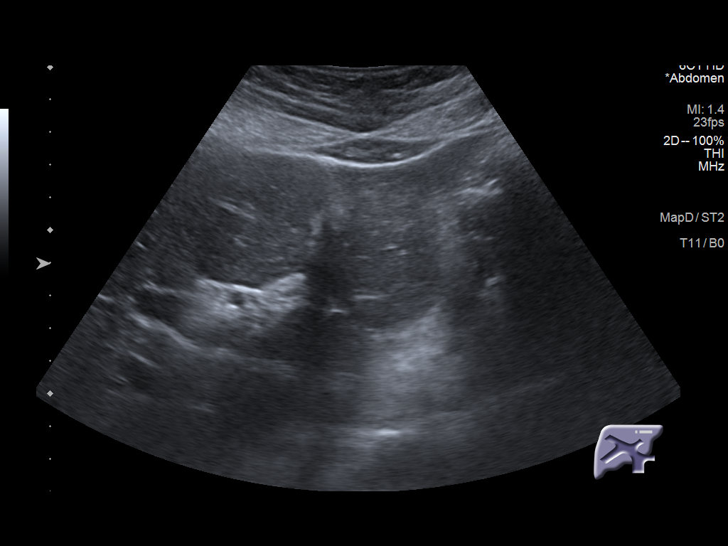
[im 32/39]
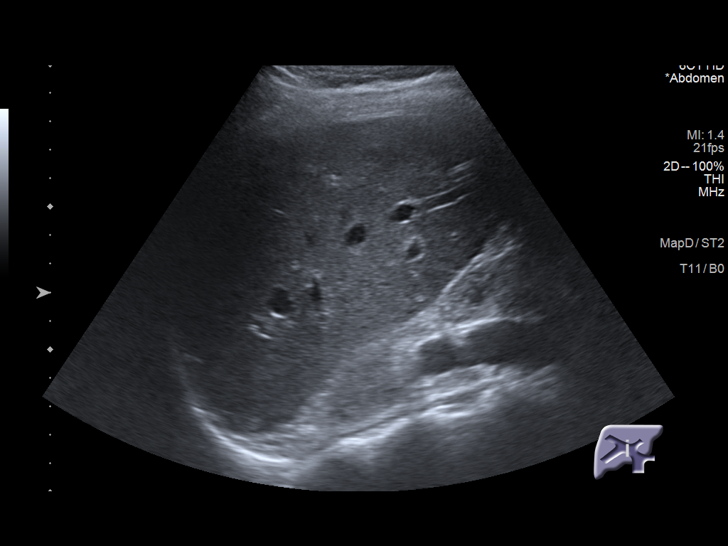
[im 35/39]
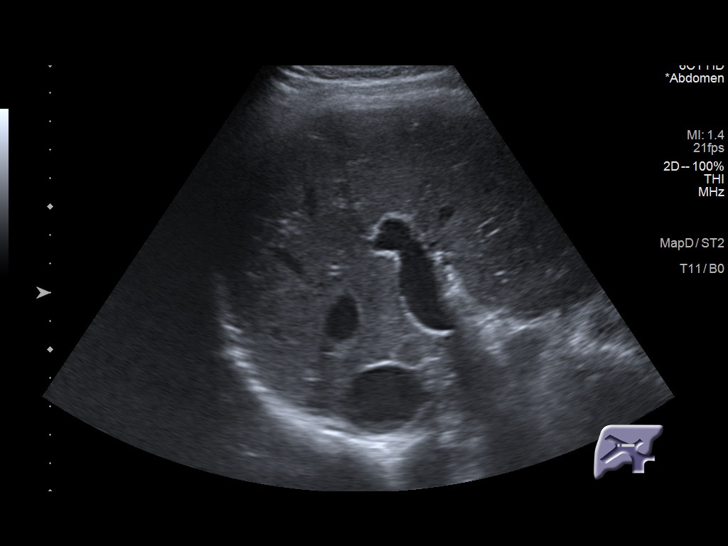
[im 39/39]
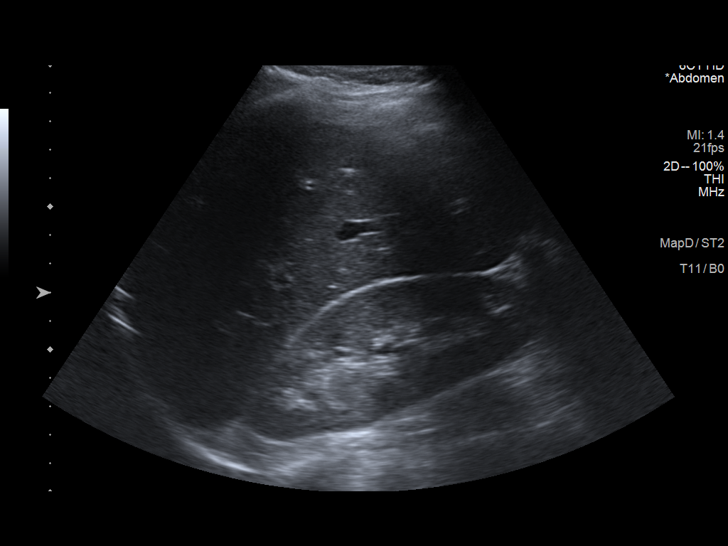

[14 of 25 positions shown; findings below may reference images not displayed]

FINDINGS: Gallbladder:

18 mm mobile gallstone evident. No gallbladder wall thickening or
pericholecystic fluid. No sonographic Murphy sign.

Common bile duct:

Diameter: 4 mm

Liver:

No focal lesion identified. Within normal limits in parenchymal
echogenicity. No intrahepatic biliary duct dilatation. Portal vein
is patent on color Doppler imaging with normal direction of blood
flow towards the liver.

Other: None.
IMPRESSION: Cholelithiasis.  Otherwise unremarkable.

## 2022-03-09 ENCOUNTER — Encounter: Payer: 59 | Admitting: Nurse Practitioner

## 2022-03-11 DIAGNOSIS — L409 Psoriasis, unspecified: Secondary | ICD-10-CM | POA: Insufficient documentation

## 2022-03-11 NOTE — Patient Instructions (Signed)

## 2022-03-13 ENCOUNTER — Ambulatory Visit (INDEPENDENT_AMBULATORY_CARE_PROVIDER_SITE_OTHER): Payer: Commercial Managed Care - PPO | Admitting: Nurse Practitioner

## 2022-03-13 ENCOUNTER — Encounter: Payer: Self-pay | Admitting: Nurse Practitioner

## 2022-03-13 VITALS — BP 121/76 | HR 70 | Temp 98.2°F | Ht 68.9 in | Wt 178.5 lb

## 2022-03-13 DIAGNOSIS — Z114 Encounter for screening for human immunodeficiency virus [HIV]: Secondary | ICD-10-CM | POA: Diagnosis not present

## 2022-03-13 DIAGNOSIS — Z136 Encounter for screening for cardiovascular disorders: Secondary | ICD-10-CM | POA: Diagnosis not present

## 2022-03-13 DIAGNOSIS — L409 Psoriasis, unspecified: Secondary | ICD-10-CM

## 2022-03-13 DIAGNOSIS — Z13220 Encounter for screening for lipoid disorders: Secondary | ICD-10-CM | POA: Diagnosis not present

## 2022-03-13 DIAGNOSIS — Z23 Encounter for immunization: Secondary | ICD-10-CM | POA: Diagnosis not present

## 2022-03-13 DIAGNOSIS — Z1159 Encounter for screening for other viral diseases: Secondary | ICD-10-CM

## 2022-03-13 DIAGNOSIS — Z0000 Encounter for general adult medical examination without abnormal findings: Secondary | ICD-10-CM

## 2022-03-13 NOTE — Assessment & Plan Note (Signed)
Chronic, ongoing, followed by dermatology.  Continue this collaboration.  Labs today.

## 2022-03-13 NOTE — Progress Notes (Signed)
BP 121/76   Pulse 70   Temp 98.2 F (36.8 C) (Oral)   Ht 5' 8.9" (1.75 m)   Wt 178 lb 8 oz (81 kg)   SpO2 98%   BMI 26.44 kg/m    Subjective:    Patient ID: Jesse Clark, male    DOB: 12-03-1985, 38 y.o.   MRN: [id number]  HPI: Jesse Clark is a 37 y.o. male presenting on 03/13/2022 for comprehensive medical examination. Current medical complaints include:none  He currently lives with: significant other + two children (7 weeks and 89 years old) Interim Problems from his last visit: no  Functional Status Survey: Is the patient deaf or have difficulty hearing?: No Does the patient have difficulty seeing, even when wearing glasses/contacts?: No Does the patient have difficulty concentrating, remembering, or making decisions?: No Does the patient have difficulty walking or climbing stairs?: No Does the patient have difficulty dressing or bathing?: No Does the patient have difficulty doing errands alone such as visiting a doctor's office or shopping?: No  FALL RISK:    03/13/2022    2:35 PM 03/08/2021    9:29 AM 07/01/2019   11:35 AM 07/01/2019   11:05 AM  [place] in the past year? 0 0 0 0  Number falls in past yr: 0 0  0  Injury with Fall? 0 0  0  Risk for fall due to : No Fall Risks No Fall Risks    Follow up Falls evaluation completed Falls evaluation completed      Depression Screen    03/13/2022    2:35 PM 03/08/2021    9:29 AM  Depression screen PHQ 2/9  Decreased Interest 0 0  Down, Depressed, Hopeless 0 0  PHQ - 2 Score 0 0  Altered sleeping 0 0  Tired, decreased energy 3   Change in appetite 0 0  Feeling bad or failure about yourself  0 0  Trouble concentrating 0 0  Moving slowly or fidgety/restless 0 0  Suicidal thoughts 0 0  PHQ-9 Score 3 0  Difficult doing work/chores Not difficult at all Not difficult at all   Advanced Directives <no information>  Past Medical History:  Past Medical History:  Diagnosis Date   Allergy    PCN,  Sulfa, Ceclor   Depression    Teenage years   Psoriasis     Surgical History:  Past Surgical History:  Procedure Laterality Date   CHOLECYSTECTOMY  2021   TONSILLECTOMY      Medications:  Current Outpatient Medications on File Prior to Visit  Medication Sig   Multiple Vitamin (MULTI-VITAMIN) tablet Take 1 tablet by mouth daily.    Tapinarof (VTAMA) 1 % CREA Apply to affected areas of psoriasis daily as needed.   No current facility-administered medications on file prior to visit.    Allergies:  Allergies  Allergen Reactions   Ceclor [Cefaclor] Hives   Elemental Sulfur Hives   Penicillins Hives    Social History:  Social History   Socioeconomic History   Marital status: Married    Spouse name: Not on file   Number of children: Not on file   Years of education: Not on file   Highest education level: Not on file  Occupational History   Not on file  Tobacco Use   Smoking status: Former    Packs/day: 0.50    Years: 2.00    Total pack years: 1.00    Types: Cigarettes   Smokeless tobacco: Never  Vaping Use   Vaping Use: Never used  Substance and Sexual Activity   Alcohol use: Yes    Alcohol/week: 2.0 standard drinks of alcohol    Types: 2 Cans of beer per week   Drug use: No   Sexual activity: Yes    Comment: Monogamous  Other Topics Concern   Not on file  Social History Narrative   Not on file   Social Determinants of Health   Financial Resource Strain: Not on file  Food Insecurity: Not on file  Transportation Needs: Not on file  Physical Activity: Not on file  Stress: Not on file  Social Connections: Not on file  Intimate Partner Violence: Not on file   Social History   Tobacco Use  Smoking Status Former   Packs/day: 0.50   Years: 2.00   Total pack years: 1.00   Types: Cigarettes  Smokeless Tobacco Never   Social History   Substance and Sexual Activity  Alcohol Use Yes   Alcohol/week: 2.0 standard drinks of alcohol   Types: 2 Cans of  beer per week    Family History:  Family History  Problem Relation Age of Onset   Hyperthyroidism Mother    Hypertension Father    Hyperlipidemia Father    Cancer Maternal Grandfather    Cancer Maternal Grandmother     Past medical history, surgical history, medications, allergies, family history and social history reviewed with patient today and changes made to appropriate areas of the chart.   ROS All other ROS negative except what is listed above and in the HPI.      Objective:    BP 121/76   Pulse 70   Temp 98.2 F (36.8 C) (Oral)   Ht 5' 8.9" (1.75 m)   Wt 178 lb 8 oz (81 kg)   SpO2 98%   BMI 26.44 kg/m   Wt Readings from Last 3 Encounters:  03/13/22 178 lb 8 oz (81 kg)  03/08/21 177 lb 12.8 oz (80.6 kg)  07/02/19 154 lb 15.7 oz (70.3 kg)    Physical Exam Vitals and nursing note reviewed.  Constitutional:      General: He is awake. He is not in acute distress.    Appearance: He is well-developed and well-groomed. He is not ill-appearing or toxic-appearing.  HENT:     Head: Normocephalic and atraumatic.     Right Ear: Hearing, tympanic membrane, ear canal and external ear normal. No drainage.     Left Ear: Hearing, tympanic membrane, ear canal and external ear normal. No drainage.     Nose: Nose normal.     Mouth/Throat:     Pharynx: Uvula midline.  Eyes:     General: Lids are normal.        Right eye: No discharge.        Left eye: No discharge.     Extraocular Movements: Extraocular movements intact.     Conjunctiva/sclera: Conjunctivae normal.     Pupils: Pupils are equal, round, and reactive to light.     Visual Fields: Right eye visual fields normal and left eye visual fields normal.  Neck:     Thyroid: No thyromegaly.     Vascular: No carotid bruit or JVD.     Trachea: Trachea normal.  Cardiovascular:     Rate and Rhythm: Normal rate and regular rhythm.     Heart sounds: Normal heart sounds, S1 normal and S2 normal. No murmur heard.    No  gallop.  Pulmonary:  Effort: Pulmonary effort is normal. No accessory muscle usage or respiratory distress.     Breath sounds: Normal breath sounds.  Abdominal:     General: Bowel sounds are normal.     Palpations: Abdomen is soft. There is no hepatomegaly or splenomegaly.     Tenderness: There is no abdominal tenderness.  Musculoskeletal:        General: Normal range of motion.     Cervical back: Normal range of motion and neck supple.     Right lower leg: No edema.     Left lower leg: No edema.  Lymphadenopathy:     Head:     Right side of head: No submental, submandibular, tonsillar, preauricular or posterior auricular adenopathy.     Left side of head: No submental, submandibular, tonsillar, preauricular or posterior auricular adenopathy.     Cervical: No cervical adenopathy.  Skin:    General: Skin is warm and dry.     Capillary Refill: Capillary refill takes less than 2 seconds.     Findings: No rash.  Neurological:     Mental Status: He is alert and oriented to person, place, and time.     Gait: Gait is intact.     Deep Tendon Reflexes: Reflexes are normal and symmetric.     Reflex Scores:      Brachioradialis reflexes are 2+ on the right side and 2+ on the left side.      Patellar reflexes are 2+ on the right side and 2+ on the left side. Psychiatric:        Attention and Perception: Attention normal.        Mood and Affect: Mood normal.        Speech: Speech normal.        Behavior: Behavior normal. Behavior is cooperative.        Thought Content: Thought content normal.        Cognition and Memory: Cognition normal.     Results for orders placed or performed in visit on 03/08/21  CBC with Differential/Platelet  Result Value Ref Range   WBC 5.1 3.4 - 10.8 x10E3/uL   RBC 5.61 4.14 - 5.80 x10E6/uL   Hemoglobin 16.3 13.0 - 17.7 g/dL   Hematocrit 47.5 37.5 - 51.0 %   MCV 85 79 - 97 fL   MCH 29.1 26.6 - 33.0 pg   MCHC 34.3 31.5 - 35.7 g/dL   RDW 13.3 11.6 -  15.4 %   Platelets 228 150 - 450 x10E3/uL   Neutrophils 59 Not Estab. %   Lymphs 30 Not Estab. %   Monocytes 8 Not Estab. %   Eos 2 Not Estab. %   Basos 1 Not Estab. %   Neutrophils Absolute 3.0 1.4 - 7.0 x10E3/uL   Lymphocytes Absolute 1.5 0.7 - 3.1 x10E3/uL   Monocytes Absolute 0.4 0.1 - 0.9 x10E3/uL   EOS (ABSOLUTE) 0.1 0.0 - 0.4 x10E3/uL   Basophils Absolute 0.0 0.0 - 0.2 x10E3/uL   Immature Granulocytes 0 Not Estab. %   Immature Grans (Abs) 0.0 0.0 - 0.1 x10E3/uL  Comprehensive metabolic panel  Result Value Ref Range   Glucose 76 70 - 99 mg/dL   BUN 12 6 - 20 mg/dL   Creatinine, Ser 0.71 (L) 0.76 - 1.27 mg/dL   eGFR 124 >59 mL/min/1.73   BUN/Creatinine Ratio 17 9 - 20   Sodium 143 134 - 144 mmol/L   Potassium 4.7 3.5 - 5.2 mmol/L   Chloride 102 96 -  106 mmol/L   CO2 25 20 - 29 mmol/L   Calcium 9.7 8.7 - 10.2 mg/dL   Total Protein 7.2 6.0 - 8.5 g/dL   Albumin 4.9 4.0 - 5.0 g/dL   Globulin, Total 2.3 1.5 - 4.5 g/dL   Albumin/Globulin Ratio 2.1 1.2 - 2.2   Bilirubin Total 0.5 0.0 - 1.2 mg/dL   Alkaline Phosphatase 66 44 - 121 IU/L   AST 24 0 - 40 IU/L   ALT 22 0 - 44 IU/L  Urinalysis, Routine w reflex microscopic  Result Value Ref Range   Specific Gravity, UA 1.015 1.005 - 1.030   pH, UA 7.0 5.0 - 7.5   Color, UA Yellow Yellow   Appearance Ur Clear Clear   Leukocytes,UA Negative Negative   Protein,UA Negative Negative/Trace   Glucose, UA Negative Negative   Ketones, UA Negative Negative   RBC, UA Negative Negative   Bilirubin, UA Negative Negative   Urobilinogen, Ur 0.2 0.2 - 1.0 mg/dL   Nitrite, UA Negative Negative  TSH  Result Value Ref Range   TSH 0.724 0.450 - 4.500 uIU/mL      Assessment & Plan:   Problem List Items Addressed This Visit       Musculoskeletal and Integument   Psoriasis - Primary    Chronic, ongoing, followed by dermatology.  Continue this collaboration.  Labs today.      Relevant Orders   CBC with Differential/Platelet    Comprehensive metabolic panel   Other Visit Diagnoses     Encounter for lipid screening for cardiovascular disease       Lipid panel performed today.   Relevant Orders   Comprehensive metabolic panel   Lipid Panel w/o Chol/HDL Ratio   Need for hepatitis C screening test       Hep C screen on labs today per guidelines for one time screening, discussed with patient.   Relevant Orders   Hepatitis C antibody   Encounter for screening for HIV       HIV screen on labs today per guidelines for one time screening, discussed with patient.   Relevant Orders   HIV Antibody (routine testing w rflx)   Flu vaccine need       Flu vaccine in office today.   Encounter for annual physical exam       Annual physical today with labs and health maintenance reviewed, discussed with patient.   Relevant Orders   TSH       LABORATORY TESTING:  Health maintenance labs ordered today as discussed above.   IMMUNIZATIONS:   - Tdap: Tetanus vaccination status reviewed: last tetanus booster within 10 years. - Influenza: Up to date - Pneumovax: Not applicable - Prevnar: Not applicable - Zostavax vaccine: Not applicable  SCREENING: - Colonoscopy: Not applicable  Discussed with patient purpose of the colonoscopy is to detect colon cancer at curable precancerous or early stages   - AAA Screening: Not applicable  -Hearing Test: Not applicable  -Spirometry: Not applicable   PATIENT COUNSELING:    Sexuality: Discussed sexually transmitted diseases, partner selection, use of condoms, avoidance of unintended pregnancy  and contraceptive alternatives.   Advised to avoid cigarette smoking.  I discussed with the patient that most people either abstain from alcohol or drink within safe limits (<=14/week and <=4 drinks/occasion for males, <=7/weeks and <= 3 drinks/occasion for females) and that the risk for alcohol disorders and other health effects rises proportionally with the number of drinks per week and how  often  a drinker exceeds daily limits.  Discussed cessation/primary prevention of drug use and availability of treatment for abuse.   Diet: Encouraged to adjust caloric intake to maintain  or achieve ideal body weight, to reduce intake of dietary saturated fat and total fat, to limit sodium intake by avoiding high sodium foods and not adding table salt, and to maintain adequate dietary potassium and calcium preferably from fresh fruits, vegetables, and low-fat dairy products.    Stressed the importance of regular exercise  Injury prevention: Discussed safety belts, safety helmets, smoke detector, smoking near bedding or upholstery.   Dental health: Discussed importance of regular tooth brushing, flossing, and dental visits.   Follow up plan: NEXT PREVENTATIVE PHYSICAL DUE IN 1 YEAR. Return in about 1 year (around 03/14/2023) for Annual physical.

## 2022-03-14 ENCOUNTER — Encounter: Payer: Self-pay | Admitting: Nurse Practitioner

## 2022-03-14 DIAGNOSIS — E7800 Pure hypercholesterolemia, unspecified: Secondary | ICD-10-CM | POA: Insufficient documentation

## 2022-03-14 LAB — CBC WITH DIFFERENTIAL/PLATELET
Basophils Absolute: 0.1 10*3/uL (ref 0.0–0.2)
Basos: 1 %
EOS (ABSOLUTE): 0.1 10*3/uL (ref 0.0–0.4)
Eos: 2 %
Hematocrit: 46.6 % (ref 37.5–51.0)
Hemoglobin: 16.2 g/dL (ref 13.0–17.7)
Immature Grans (Abs): 0 10*3/uL (ref 0.0–0.1)
Immature Granulocytes: 0 %
Lymphocytes Absolute: 2 10*3/uL (ref 0.7–3.1)
Lymphs: 26 %
MCH: 28.8 pg (ref 26.6–33.0)
MCHC: 34.8 g/dL (ref 31.5–35.7)
MCV: 83 fL (ref 79–97)
Monocytes Absolute: 0.5 10*3/uL (ref 0.1–0.9)
Monocytes: 7 %
Neutrophils Absolute: 4.9 10*3/uL (ref 1.4–7.0)
Neutrophils: 64 %
Platelets: 221 10*3/uL (ref 150–450)
RBC: 5.62 x10E6/uL (ref 4.14–5.80)
RDW: 12.5 % (ref 11.6–15.4)
WBC: 7.7 10*3/uL (ref 3.4–10.8)

## 2022-03-14 LAB — COMPREHENSIVE METABOLIC PANEL
ALT: 16 IU/L (ref 0–44)
AST: 23 IU/L (ref 0–40)
Albumin/Globulin Ratio: 2 (ref 1.2–2.2)
Albumin: 4.9 g/dL (ref 4.1–5.1)
Alkaline Phosphatase: 66 IU/L (ref 44–121)
BUN/Creatinine Ratio: 15 (ref 9–20)
BUN: 14 mg/dL (ref 6–20)
Bilirubin Total: 0.5 mg/dL (ref 0.0–1.2)
CO2: 25 mmol/L (ref 20–29)
Calcium: 9.7 mg/dL (ref 8.7–10.2)
Chloride: 99 mmol/L (ref 96–106)
Creatinine, Ser: 0.92 mg/dL (ref 0.76–1.27)
Globulin, Total: 2.4 g/dL (ref 1.5–4.5)
Glucose: 81 mg/dL (ref 70–99)
Potassium: 4.4 mmol/L (ref 3.5–5.2)
Sodium: 142 mmol/L (ref 134–144)
Total Protein: 7.3 g/dL (ref 6.0–8.5)
eGFR: 112 mL/min/{1.73_m2} (ref >59)

## 2022-03-14 LAB — LIPID PANEL W/O CHOL/HDL RATIO
Cholesterol, Total: 228 mg/dL — ABNORMAL HIGH (ref 100–199)
HDL: 88 mg/dL (ref >39)
LDL Chol Calc (NIH): 118 mg/dL — ABNORMAL HIGH (ref 0–99)
Triglycerides: 129 mg/dL (ref 0–149)
VLDL Cholesterol Cal: 22 mg/dL (ref 5–40)

## 2022-03-14 LAB — HIV ANTIBODY (ROUTINE TESTING W REFLEX): HIV Screen 4th Generation wRfx: NONREACTIVE

## 2022-03-14 LAB — HEPATITIS C ANTIBODY: Hep C Virus Ab: NONREACTIVE

## 2022-03-14 LAB — TSH: TSH: 1.06 u[IU]/mL (ref 0.450–4.500)

## 2022-03-14 NOTE — Progress Notes (Signed)
Contacted via [place] afternoon [place], your labs have returned and are overall stable with exception of cholesterol levels.  Your cholesterol is high, but recommendations to make lifestyle changes. Your LDL is above normal. The LDL is the bad cholesterol. Over time and in combination with inflammation and other factors, this contributes to plaque which in turn may lead to stroke and/or heart attack down the road. Sometimes high LDL is primarily genetic, and people might be eating all the right foods but still have high numbers. Other times, there is room for improvement in one's diet and eating healthier can bring this number down and potentially reduce one's risk of heart attack and/or stroke.   To reduce your LDL, Remember - more fruits and vegetables, more fish, and limit red meat and dairy products. More soy, nuts, beans, barley, lentils, oats and plant sterol ester enriched margarine instead of butter. I also encourage eliminating sugar and processed food. Remember, shop on the outside of the grocery store and visit your [employer]. If you would like to talk with me about dietary changes for your cholesterol, please let me know. We should recheck your cholesterol in 12 months.  Any questions? Keep being awesome!!  Thank you for allowing me to participate in your care.  I appreciate you. Kindest regards, Maurice Fotheringham

## 2022-06-06 ENCOUNTER — Ambulatory Visit (INDEPENDENT_AMBULATORY_CARE_PROVIDER_SITE_OTHER): Payer: Commercial Managed Care - PPO | Admitting: Family Medicine

## 2022-06-06 ENCOUNTER — Encounter: Payer: Self-pay | Admitting: Family Medicine

## 2022-06-06 ENCOUNTER — Other Ambulatory Visit: Payer: Self-pay

## 2022-06-06 ENCOUNTER — Ambulatory Visit
Admission: RE | Admit: 2022-06-06 | Discharge: 2022-06-06 | Disposition: A | Discharge status: Home / Self Care | Payer: Commercial Managed Care - PPO | Attending: Family Medicine | Admitting: Family Medicine

## 2022-06-06 ENCOUNTER — Ambulatory Visit
Admission: RE | Admit: 2022-06-06 | Discharge: 2022-06-06 | Disposition: A | Discharge status: Home / Self Care | Payer: Commercial Managed Care - PPO | Source: Ambulatory Visit | Attending: Family Medicine | Admitting: Family Medicine

## 2022-06-06 VITALS — BP 130/80 | HR 90 | Ht 69.0 in | Wt 186.0 lb

## 2022-06-06 DIAGNOSIS — M222X2 Patellofemoral disorders, left knee: Secondary | ICD-10-CM | POA: Diagnosis not present

## 2022-06-06 DIAGNOSIS — M25561 Pain in right knee: Secondary | ICD-10-CM | POA: Diagnosis not present

## 2022-06-06 DIAGNOSIS — M222X1 Patellofemoral disorders, right knee: Secondary | ICD-10-CM | POA: Diagnosis not present

## 2022-06-06 DIAGNOSIS — M25562 Pain in left knee: Secondary | ICD-10-CM | POA: Diagnosis not present

## 2022-06-06 MED ORDER — MELOXICAM 15 MG PO TABS
15.0000 mg | ORAL_TABLET | Freq: Every day | ORAL | 0 refills | Status: DC
  Filled 2022-06-06: qty 30, 30d supply, fill #0

## 2022-06-06 NOTE — Assessment & Plan Note (Signed)
Symptoms since patient's 20s, intermittent, typically short-lived, most recently noted progression over the past 3-4 weeks with increased frequency and longer duration of symptoms.  Localized anterior knees bilaterally mild radiation distally to the proximal third of the tibia, associated with instability, clicking/popping, painful ADLs.  Denies mechanical locking, no buckling, no swelling.  Has been on naproxen to 20 mg twice daily for the past several weeks without significant improvement.  Findings evidence of dynamic maltracking, no effusion, tenderness minimally along the patellar facets, painful maximal extension and flexion to 135 degrees, single-leg squat with increased valgus on right, left benign, mild discomfort with valgus stressing on the right with firm endpoint, otherwise negative anterior/posterior drawer, valgus/varus stressing otherwise, negative medial and lateral McMurray's.  Findings consistent with bilateral patellofemoral syndrome, secondary right MCL involvement.  - Obtain bilateral knee x-rays - Start meloxicam daily x 2 weeks then as needed - Patellar stabilizing braces during work hours, while active, and while symptomatic - Formal PT - 4-week return, suboptimal progress to be addressed with consideration of intra-articular cortisone, advanced imaging

## 2022-06-06 NOTE — Progress Notes (Signed)
     Primary Care / Sports Medicine Office Visit  Patient Information:  Patient ID: Jesse Clark, male DOB: 1985-12-19 Age: 37 y.o. MRN: [id number]   Jesse Clark is a pleasant 37 y.o. male presenting with the following:  Chief Complaint  Patient presents with   Knee Pain    Bilateral 3 weeks, no treatment or xrays    Vitals:   06/06/22 1039  BP: 130/80  Pulse: 90  SpO2: 99%   Vitals:   06/06/22 1039  Weight: 186 lb (84.4 kg)  Height: 5\' 9"  (1.753 m)   Body mass index is 27.47 kg/m.  No results found.   Independent interpretation of notes and tests performed by another provider:   None  Procedures performed:   None  Pertinent History, Exam, Impression, and Recommendations:   Dare was seen today for knee pain.  Patellofemoral syndrome, bilateral Assessment & Plan: Symptoms since patient's 20s, intermittent, typically short-lived, most recently noted progression over the past 3-4 weeks with increased frequency and longer duration of symptoms.  Localized anterior knees bilaterally mild radiation distally to the proximal third of the tibia, associated with instability, clicking/popping, painful ADLs.  Denies mechanical locking, no buckling, no swelling.  Has been on naproxen to 20 mg twice daily for the past several weeks without significant improvement.  Findings evidence of dynamic maltracking, no effusion, tenderness minimally along the patellar facets, painful maximal extension and flexion to 135 degrees, single-leg squat with increased valgus on right, left benign, mild discomfort with valgus stressing on the right with firm endpoint, otherwise negative anterior/posterior drawer, valgus/varus stressing otherwise, negative medial and lateral McMurray's.  Findings consistent with bilateral patellofemoral syndrome, secondary right MCL involvement.  - Obtain bilateral knee x-rays - Start meloxicam daily x 2 weeks then as needed - Patellar stabilizing braces  during work hours, while active, and while symptomatic - Formal PT - 4-week return, suboptimal progress to be addressed with consideration of intra-articular cortisone, advanced imaging  Orders: -     DG Knee Complete 4 Views Right; Future -     DG Knee Complete 4 Views Left; Future -     Meloxicam; Take 1 tablet (15 mg total) by mouth daily.  Dispense: 30 tablet; Refill: 0 -     Ambulatory referral to Physical Therapy     Orders & Medications Meds ordered this encounter  Medications   meloxicam (MOBIC) 15 MG tablet    Sig: Take 1 tablet (15 mg total) by mouth daily.    Dispense:  30 tablet    Refill:  0   Orders Placed This Encounter  Procedures   DG Knee Complete 4 Views Right   DG Knee Complete 4 Views Left   Ambulatory referral to Physical Therapy     Return in about 4 weeks (around 07/04/2022).     Jerrol Banana, MD, [facility]   Primary Care Sports Medicine Primary Care and Sports Medicine at [facility]

## 2022-06-06 NOTE — Patient Instructions (Signed)
-   Obtain x-rays today - Start meloxicam daily with food x 2 weeks (no other NSAIDs while this medication) - After 2 weeks continue meloxicam daily as needed for knee pain - Wear knee braces at all times while on your feet and symptomatic - Start physical therapy at [facility] - Return for follow-up in 4 weeks, contact office for any questions/concerns between now and then

## 2022-06-13 ENCOUNTER — Ambulatory Visit: Payer: Commercial Managed Care - PPO | Attending: Family Medicine

## 2022-06-13 DIAGNOSIS — R262 Difficulty in walking, not elsewhere classified: Secondary | ICD-10-CM | POA: Diagnosis not present

## 2022-06-13 DIAGNOSIS — M25562 Pain in left knee: Secondary | ICD-10-CM | POA: Insufficient documentation

## 2022-06-13 DIAGNOSIS — M25561 Pain in right knee: Secondary | ICD-10-CM | POA: Diagnosis not present

## 2022-06-13 DIAGNOSIS — M6281 Muscle weakness (generalized): Secondary | ICD-10-CM | POA: Diagnosis not present

## 2022-06-13 DIAGNOSIS — M222X2 Patellofemoral disorders, left knee: Secondary | ICD-10-CM | POA: Insufficient documentation

## 2022-06-13 DIAGNOSIS — M222X1 Patellofemoral disorders, right knee: Secondary | ICD-10-CM | POA: Diagnosis not present

## 2022-06-13 DIAGNOSIS — G8929 Other chronic pain: Secondary | ICD-10-CM | POA: Insufficient documentation

## 2022-06-13 NOTE — Therapy (Signed)
OUTPATIENT PHYSICAL THERAPY LOWER EXTREMITY EVALUATION   Patient Name: Jesse Clark MRN: [id number] DOB:21-May-1985, 37 y.o., male Today's Date: 06/14/2022  END OF SESSION:  PT End of Session - 06/13/22 1533     Visit Number 1    Number of Visits 16    Date for PT Re-Evaluation 08/08/22    PT Start Time 1302    PT Stop Time 1345    PT Time Calculation (min) 43 min    Activity Tolerance Patient tolerated treatment well;Patient limited by pain    Behavior During Therapy [facility] for tasks assessed/performed             Past Medical History:  Diagnosis Date   Allergy    PCN, Sulfa, Ceclor   Depression    Teenage years   Psoriasis    Past Surgical History:  Procedure Laterality Date   CHOLECYSTECTOMY  2021   TONSILLECTOMY     Patient Active Problem List   Diagnosis Date Noted   Patellofemoral syndrome, bilateral 06/06/2022   Elevated low density lipoprotein (LDL) cholesterol level 03/14/2022   Psoriasis 03/11/2022    PCP: Marjie Skiff, NP   REFERRING PROVIDER: Jerrol Banana, MD   REFERRING DIAG: M22.2X1,M22.2X2 (ICD-10-CM) - Patellofemoral syndrome, bilateral   THERAPY DIAG:  Chronic pain of right knee  Chronic pain of left knee  Muscle weakness (generalized)  Difficulty in walking, not elsewhere classified  Rationale for Evaluation and Treatment: Rehabilitation  ONSET DATE: Current episode onset 5 weeks ago March 2024, initial onset 5-6 years ago   SUBJECTIVE:   SUBJECTIVE STATEMENT: Pt is a pleasant 37 yo male presenting to PT eval for bilateral knee pain with diagnosis of bilateral PFPS per referral. Pt reports bilat knee pain on and off for past 5-6 years. 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 He reports no history of trauma, and states pain typically occurs after "heavy use" days. He did feel as if his knees were about to "give out" on first day of symptom onset,  but has not had this feeling since. He reports no swelling/inflammation. The pain has not been improving and current pain episode is lasting longer than what it typically lasts. Pt feels pain when walking and is limited to 1 hour, standing is painful after 15 minutes. Pt reports no pain when sitting currently, but can experience pain after a while. He describes the pain as a dull ache, can be sharp. Pain region is isolated to anterior patella and along patellar tendon to insertion/tibial tuberosity region. Denies posterior knee pain. Pain drops to 2/10 with seated rest. Pain reached 7-8/10 with initial onset. Average pain has been around 4/10. Prior to recent pain onset the pt was going to the gym 2x/week. He would like to return to this. He also has two young kids at home and would like to improve ease with ADLs/job duties. He is currently limited with squatting, walking, standing, stair-climbing, and other functional activities.   PERTINENT HISTORY: Pt is a pleasant 37 yo male presenting to PT eval for bilateral knee pain with diagnosis of bilateral PFPS per referral. Pt reports bilat knee pain on and off for past 5-6 years. 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 He reports no history of trauma, and states pain typically occurs after "heavy use" days. He did feel as if his knees were about to "give out"  on first day of symptom onset, but has not had this feeling since. He reports no swelling/inflammation. The pain has not been improving and current pain episode is lasting longer than what it typically lasts. Pt feels pain when walking and is limited to 1 hour, standing is painful after 15 minutes. Pt reports no pain when sitting currently, but can experience pain after a while. He describes the pain as a dull ache, can be sharp. Pain region is isolated to anterior patella and along patellar tendon to insertion/tibial tuberosity region.  Denies posterior knee pain. Pain drops to 2/10 with seated rest. Pain reached 7-8/10 with initial onset. Average pain has been around 4/10. Prior to recent pain onset the pt was going to the gym 2x/week. He would like to return to this. He also has two young kids at home and would like to improve ease with ADLs/job duties. He is currently limited with squatting, walking, standing, stair-climbing, and other functional activities.  PMH significant for psoriasis, depression (teenage years) PAIN:  Are you having pain? Yes: NPRS scale: 4/10 Pain location: anterior patella and patellar tendon down to tibial tuberosity bilaterally Pain description: sharp, achey Aggravating factors: standing, walking, prolonged activity Relieving factors: sitting  PRECAUTIONS: None  WEIGHT BEARING RESTRICTIONS: No  FALLS:  Has patient fallen in last 6 months? No  LIVING ENVIRONMENT: Lives with: lives with their family, lives with their spouse, and has 83 mo old and 52 year old   OCCUPATION: Buyer, retail at [employer], 12 hour shifts  PLOF: Independent  PATIENT GOALS: Get rid of pain, return to gym/activity  NEXT MD VISIT: May 8th   OBJECTIVE:   DIAGNOSTIC FINDINGS:   DG knee LEFT 06/06/2022: "IMPRESSION: Enthesopathic changes off the superior patella. No other significant abnormalities."  DG Knee RIGHT 06/06/2022: "IMPRESSION: Enthesopathic changes off the superior patella. No other abnormalities."  PATIENT SURVEYS:  LEFS 58/80 FOTO 63 (goal 82)  COGNITION: Overall cognitive status: Within functional limits for tasks assessed     SENSATION: WFL  EDEMA:  None observed or reported  MUSCLE LENGTH: Hamstrings: Right lacking 55 deg from full ext; Left lacking 60 deg from full ext    POSTURE: No Significant postural limitations  PALPATION:  No pain with patellar glides in all planes bilaterally, pt exhibits good movement and only mild crepitus. Pt is not TTP over bilat patella, along  patellar tendon bilaterally or quad tendon bilaterally   LOWER EXTREMITY MMT: MMT indicates LE strength is 5/5 for LE musculature with exception of the following: - Hip flexors 4+/5 bilaterally -Hip ER/IR 4+/5 bilat, pain-limited with ER/IR on L side -Hip extension 4/5 bliat  *knee extension was 5/5 bilaterally but mildly painful each LE  LE ROM: see muscle length testing, further assessment deferred   LOWER EXTREMITY SPECIAL TESTS:   Maisie Fus test: positive bilaterally OBER: positive bilaterally, L side tighter than R  FUNCTIONAL TESTS:  - : 1.06 m/s, observed pronation bilat LE, minimal trunk rotation.  -Single leg stance:  no hip drop observed bilaterally  - Squatting 10 reps. Pt reports onset of knee pain with 4th rep   GAIT: Distance walked: clinic distances, *see above) Assistive device utilized: None Level of assistance: Complete Independence Comments: pronation bilaterally, decreased trunk rotation, gait speed below age norm    TODAY'S TREATMENT:  DATE:   Instructed pt in HEP: Seated hamstring stretch 2x30 sec each LE Supine active straight leg raise 12 reps each LE. Fatiguing   PATIENT EDUCATION:  Education details: Exam findings, indications, plan, HEP, exercise technique  Person educated: Patient Education method: Explanation, Demonstration, Tactile cues, Verbal cues, and Handouts Education comprehension: verbalized understanding, returned demonstration, verbal cues required, tactile cues required, and needs further education  HOME EXERCISE PROGRAM: Access Code: [email or web address] URL: [email or web address] Date: 06/13/2022 Prepared by: Temple Pacini  Exercises - Seated Hamstring Stretch  - 1 x daily - 7 x weekly - 2 sets - 1 reps - 30 seconds hold - Supine Active Straight Leg Raise  - 1 x daily - 5 x weekly - 2 sets - 12  reps - 1 second hold  ASSESSMENT:  CLINICAL IMPRESSION: Patient is a pleasant 38 y.o. male who was seen today for physical therapy evaluation and treatment for bilateral knee pain. Exam findings indicate decreased bilateral LE strength, impaired gait speed, ability and mechanics, and decreased LE ROM/mobility AEB positive hamstring length, OBER and Thomas tests. Pt also found to have pain provocation with functional movements such as squatting, and reports limitations with standing and ambulation due to knee pain. These impairments are currently affecting pt's ability to perform job duties and ADLs. The pt will benefit from further skilled PT to improve pain, strength, gait and mobility in order to increase QOL and return to PLOF.    OBJECTIVE IMPAIRMENTS: Abnormal gait, decreased activity tolerance, decreased mobility, difficulty walking, decreased ROM, decreased strength, impaired flexibility, improper body mechanics, and pain.   ACTIVITY LIMITATIONS: lifting, sitting, standing, squatting, stairs, and locomotion level  PARTICIPATION LIMITATIONS: meal prep, cleaning, laundry, driving, shopping, community activity, occupation, and yard work  PERSONAL FACTORS: Time since onset of injury/illness/exacerbation are also affecting patient's functional outcome.   REHAB POTENTIAL: Good  CLINICAL DECISION MAKING: Stable/uncomplicated  EVALUATION COMPLEXITY: Low   GOALS: Goals reviewed with patient? Yes  SHORT TERM GOALS: Target date: 07/11/2022   Patient will be independent in home exercise program to improve strength/mobility for better functional independence with ADLs. Baseline: initiated Goal status: INITIAL  2.  The pt will report average bilateral knee pain no greater than 2/10 to indicate increased QOL. Baseline: average pain 4/10 Goal status: INITIAL   LONG TERM GOALS: Target date: 08/08/2022    Patient will be independent in home exercise program to improve strength/mobility  for better functional independence with ADLs. Baseline: initiated  Goal status: INITIAL  2.  Patient will increase FOTO score to equal to or greater than  82   to demonstrate statistically significant improvement in mobility and quality of life.  Baseline: 63 Goal status: INITIAL  3.  Patient will increase 10 meter walk test to >1.3 m/s as to  indicate improve gait speed for better community ambulation and an indication that pt gait speed has returned to age-matched norms.  Baseline: 1.06 m/s, pain-limited Goal status: INITIAL  4.  Pt will exhibit ability to squat 10x pain-free to indicate improved ease with functional movement necessary for work and home activities Baseline: pain with 4th rep Goal status: INITIAL  5.  Patient will increase bilateral hip flexor and hip extensor strength to  5/5 as to improve functional strength for gait, and increased standing tolerance and increased ADL ability. Baseline: hip flexors 4+/5, hip extensors 4/5 bilat Goal status: INITIAL  6.  Patient will increase lower extremity functional scale by 9 points to demonstrate minimal detectable change in functional mobility  and increased tolerance with ADLs.  Baseline: 58 Goal status: INITIAL  7. The patient will report average bilateral knee pain no greater than 1/10 within the past week to indicate improved functional mobility and QOL. Baseline: average 4/10 Goal status: INITIAL   PLAN:  PT FREQUENCY: 1-2x/week  PT DURATION: 8 weeks  PLANNED INTERVENTIONS: Therapeutic exercises, Therapeutic activity, Neuromuscular re-education, Balance training, Gait training, Patient/Family education, Self Care, Joint mobilization, Joint manipulation, Stair training, Orthotic/Fit training, DME instructions, Dry Needling, Electrical stimulation, Spinal manipulation, Spinal mobilization, Cryotherapy, Moist heat, scar mobilization, Splintting, Taping, Traction, Ultrasound, Manual therapy, and Re-evaluation  PLAN FOR  NEXT SESSION: complete further assessment as indicate, strengthening, stretching, manual   Baird Kay, PT 06/14/2022, 10:31 AM

## 2022-06-18 ENCOUNTER — Ambulatory Visit: Payer: Commercial Managed Care - PPO | Admitting: Physical Therapy

## 2022-06-20 ENCOUNTER — Ambulatory Visit: Payer: Commercial Managed Care - PPO

## 2022-06-20 ENCOUNTER — Encounter: Payer: Commercial Managed Care - PPO | Admitting: Physical Therapy

## 2022-06-20 DIAGNOSIS — M25562 Pain in left knee: Secondary | ICD-10-CM | POA: Diagnosis not present

## 2022-06-20 DIAGNOSIS — R262 Difficulty in walking, not elsewhere classified: Secondary | ICD-10-CM | POA: Diagnosis not present

## 2022-06-20 DIAGNOSIS — G8929 Other chronic pain: Secondary | ICD-10-CM

## 2022-06-20 DIAGNOSIS — M6281 Muscle weakness (generalized): Secondary | ICD-10-CM

## 2022-06-20 DIAGNOSIS — M222X1 Patellofemoral disorders, right knee: Secondary | ICD-10-CM | POA: Diagnosis not present

## 2022-06-20 DIAGNOSIS — M25561 Pain in right knee: Secondary | ICD-10-CM | POA: Diagnosis not present

## 2022-06-20 DIAGNOSIS — M222X2 Patellofemoral disorders, left knee: Secondary | ICD-10-CM | POA: Diagnosis not present

## 2022-06-20 NOTE — Therapy (Signed)
OUTPATIENT PHYSICAL THERAPY LOWER EXTREMITY TREATMENT   Patient Name: Jesse Clark MRN: [id number] DOB:1985/10/10, 37 y.o., male Today's Date: 06/20/2022  END OF SESSION:  PT End of Session - 06/20/22 1354     Visit Number 2    Number of Visits 16    Date for PT Re-Evaluation 08/08/22    PT Start Time 1349    PT Stop Time 1428    PT Time Calculation (min) 39 min    Activity Tolerance Patient tolerated treatment well;Patient limited by pain    Behavior During Therapy [facility] for tasks assessed/performed              Past Medical History:  Diagnosis Date   Allergy    PCN, Sulfa, Ceclor   Depression    Teenage years   Psoriasis    Past Surgical History:  Procedure Laterality Date   CHOLECYSTECTOMY  2021   TONSILLECTOMY     Patient Active Problem List   Diagnosis Date Noted   Patellofemoral syndrome, bilateral 06/06/2022   Elevated low density lipoprotein (LDL) cholesterol level 03/14/2022   Psoriasis 03/11/2022    PCP: Marjie Skiff, NP   REFERRING PROVIDER: Jerrol Banana, MD   REFERRING DIAG: M22.2X1,M22.2X2 (ICD-10-CM) - Patellofemoral syndrome, bilateral   THERAPY DIAG:  Chronic pain of right knee  Chronic pain of left knee  Muscle weakness (generalized)  Rationale for Evaluation and Treatment: Rehabilitation  ONSET DATE: Current episode onset 5 weeks ago March 2024, initial onset 5-6 years ago   SUBJECTIVE:   SUBJECTIVE STATEMENT: Pt is a pleasant 37 yo male presenting to PT eval for bilateral knee pain with diagnosis of bilateral PFPS per referral. Pt reports bilat knee pain on and off for past 5-6 years. 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 He reports no history of trauma, and states pain typically occurs after "heavy use" days. He did feel as if his knees were about to "give out" on first day of symptom onset, but has not had this feeling since. He reports no  swelling/inflammation. The pain has not been improving and current pain episode is lasting longer than what it typically lasts. Pt feels pain when walking and is limited to 1 hour, standing is painful after 15 minutes. Pt reports no pain when sitting currently, but can experience pain after a while. He describes the pain as a dull ache, can be sharp. Pain region is isolated to anterior patella and along patellar tendon to insertion/tibial tuberosity region. Denies posterior knee pain. Pain drops to 2/10 with seated rest. Pain reached 7-8/10 with initial onset. Average pain has been around 4/10. Prior to recent pain onset the pt was going to the gym 2x/week. He would like to return to this. He also has two young kids at home and would like to improve ease with ADLs/job duties. He is currently limited with squatting, walking, standing, stair-climbing, and other functional activities.   PERTINENT HISTORY: Pt is a pleasant 37 yo male presenting to PT eval for bilateral knee pain with diagnosis of bilateral PFPS per referral. Pt reports bilat knee pain on and off for past 5-6 years. 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 He reports no history of trauma, and states pain typically occurs after "heavy use" days. He did feel as if his knees were about to "give out" on first day of symptom onset,  but has not had this feeling since. He reports no swelling/inflammation. The pain has not been improving and current pain episode is lasting longer than what it typically lasts. Pt feels pain when walking and is limited to 1 hour, standing is painful after 15 minutes. Pt reports no pain when sitting currently, but can experience pain after a while. He describes the pain as a dull ache, can be sharp. Pain region is isolated to anterior patella and along patellar tendon to insertion/tibial tuberosity region. Denies posterior knee pain. Pain drops to 2/10  with seated rest. Pain reached 7-8/10 with initial onset. Average pain has been around 4/10. Prior to recent pain onset the pt was going to the gym 2x/week. He would like to return to this. He also has two young kids at home and would like to improve ease with ADLs/job duties. He is currently limited with squatting, walking, standing, stair-climbing, and other functional activities.  PMH significant for psoriasis, depression (teenage years) PAIN:  Are you having pain? Yes: NPRS scale: 4/10 Pain location: anterior patella and patellar tendon down to tibial tuberosity bilaterally Pain description: sharp, achey Aggravating factors: standing, walking, prolonged activity Relieving factors: sitting  PRECAUTIONS: None  WEIGHT BEARING RESTRICTIONS: No  FALLS:  Has patient fallen in last 6 months? No  LIVING ENVIRONMENT: Lives with: lives with their family, lives with their spouse, and has 54 mo old and 23 year old   OCCUPATION: Buyer, retail at [employer], 12 hour shifts  PLOF: Independent  PATIENT GOALS: Get rid of pain, return to gym/activity  NEXT MD VISIT: May 8th   OBJECTIVE:   DIAGNOSTIC FINDINGS:   DG knee LEFT 06/06/2022: "IMPRESSION: Enthesopathic changes off the superior patella. No other significant abnormalities."  DG Knee RIGHT 06/06/2022: "IMPRESSION: Enthesopathic changes off the superior patella. No other abnormalities."  PATIENT SURVEYS:  LEFS 58/80 FOTO 63 (goal 82)  COGNITION: Overall cognitive status: Within functional limits for tasks assessed     SENSATION: WFL  EDEMA:  None observed or reported  MUSCLE LENGTH: Hamstrings: Right lacking 55 deg from full ext; Left lacking 60 deg from full ext    POSTURE: No Significant postural limitations  PALPATION:  No pain with patellar glides in all planes bilaterally, pt exhibits good movement and only mild crepitus. Pt is not TTP over bilat patella, along patellar tendon bilaterally or quad tendon  bilaterally   LOWER EXTREMITY MMT: MMT indicates LE strength is 5/5 for LE musculature with exception of the following: - Hip flexors 4+/5 bilaterally -Hip ER/IR 4+/5 bilat, pain-limited with ER/IR on L side -Hip extension 4/5 bliat  *knee extension was 5/5 bilaterally but mildly painful each LE  LE ROM: see muscle length testing, further assessment deferred   LOWER EXTREMITY SPECIAL TESTS:   Maisie Fus test: positive bilaterally OBER: positive bilaterally, L side tighter than R  FUNCTIONAL TESTS:  - : 1.06 m/s, observed pronation bilat LE, minimal trunk rotation.  -Single leg stance:  no hip drop observed bilaterally  - Squatting 10 reps. Pt reports onset of knee pain with 4th rep   GAIT: Distance walked: clinic distances, *see above) Assistive device utilized: None Level of assistance: Complete Independence Comments: pronation bilaterally, decreased trunk rotation, gait speed below age norm    TODAY'S TREATMENT:  DATE:    06/20/2022 Manual Therapy:  - Patella mobs- (M/L/Sup/Inf) - hypermobile with no restriction- BLE -STM along patella tendon and distal quad tendon BLE   Instructed pt in HEP: - Clamshell  -2 x 10 reps each LE - Sidelying Hip Abduction  2 x10 reps each LE - Sidelying Hip Adduction 2 x  10 reps each LE  Verbally review previously prescribed HEP:  Seated hamstring stretch  Supine active straight leg raise 12 reps each LE- No issues today   PATIENT EDUCATION:  Education details: HEP, exercise technique  Person educated: Patient Education method: Explanation, Demonstration, Tactile cues, Verbal cues, and Handouts Education comprehension: verbalized understanding, returned demonstration, verbal cues required, tactile cues required, and needs further education  HOME EXERCISE PROGRAM:  Access Code: [email or web address] URL:  [email or web address] Date: 06/20/2022 Prepared by: Maureen Ralphs  Exercises - Clamshell  - 1 x daily - 3 x weekly - 3 sets - 10 reps - Sidelying Hip Abduction  - 1 x daily - 3 x weekly - 3 sets - 10 reps - Sidelying Hip Adduction with Ankle Weight - Leg In Front  - 1 x daily - 3 x weekly - 3 sets - 10 reps       Access Code: [email or web address] URL: [email or web address] Date: 06/13/2022 Prepared by: Temple Pacini  Exercises - Seated Hamstring Stretch  - 1 x daily - 7 x weekly - 2 sets - 1 reps - 30 seconds hold - Supine Active Straight Leg Raise  - 1 x daily - 5 x weekly - 2 sets - 12 reps - 1 second hold  ASSESSMENT:  CLINICAL IMPRESSION: Patient received today with good motivation for 2nd visit. He presents with no particular pain with non- weight bearing and some tenderness along bilateral patella tendon with Soft tissue mobilization. He was instructed in more hip strengthening today to work on improving strength to help patella track properly. He responded well overall- able to return demonstration and no pain throughout session.  The pt will benefit from further skilled PT to improve pain, strength, gait and mobility in order to increase QOL and return to PLOF.    OBJECTIVE IMPAIRMENTS: Abnormal gait, decreased activity tolerance, decreased mobility, difficulty walking, decreased ROM, decreased strength, impaired flexibility, improper body mechanics, and pain.   ACTIVITY LIMITATIONS: lifting, sitting, standing, squatting, stairs, and locomotion level  PARTICIPATION LIMITATIONS: meal prep, cleaning, laundry, driving, shopping, community activity, occupation, and yard work  PERSONAL FACTORS: Time since onset of injury/illness/exacerbation are also affecting patient's functional outcome.   REHAB POTENTIAL: Good  CLINICAL DECISION MAKING: Stable/uncomplicated  EVALUATION COMPLEXITY: Low   GOALS: Goals reviewed with patient? Yes  SHORT TERM GOALS:  Target date: 07/11/2022   Patient will be independent in home exercise program to improve strength/mobility for better functional independence with ADLs. Baseline: initiated Goal status: INITIAL  2.  The pt will report average bilateral knee pain no greater than 2/10 to indicate increased QOL. Baseline: average pain 4/10 Goal status: INITIAL   LONG TERM GOALS: Target date: 08/08/2022    Patient will be independent in home exercise program to improve strength/mobility for better functional independence with ADLs. Baseline: initiated  Goal status: INITIAL  2.  Patient will increase FOTO score to equal to or greater than  82   to demonstrate statistically significant improvement in mobility and quality of life.  Baseline: 63 Goal status: INITIAL  3.  Patient will increase 10 meter walk test to >1.3 m/s as to  indicate improve gait speed  for better community ambulation and an indication that pt gait speed has returned to age-matched norms.  Baseline: 1.06 m/s, pain-limited Goal status: INITIAL  4.  Pt will exhibit ability to squat 10x pain-free to indicate improved ease with functional movement necessary for work and home activities Baseline: pain with 4th rep Goal status: INITIAL  5.  Patient will increase bilateral hip flexor and hip extensor strength to  5/5 as to improve functional strength for gait, and increased standing tolerance and increased ADL ability. Baseline: hip flexors 4+/5, hip extensors 4/5 bilat Goal status: INITIAL  6.  Patient will increase lower extremity functional scale by 9 points to demonstrate minimal detectable change in functional mobility and increased tolerance with ADLs.  Baseline: 58 Goal status: INITIAL  7. The patient will report average bilateral knee pain no greater than 1/10 within the past week to indicate improved functional mobility and QOL. Baseline: average 4/10 Goal status: INITIAL   PLAN:  PT FREQUENCY: 1-2x/week  PT DURATION: 8  weeks  PLANNED INTERVENTIONS: Therapeutic exercises, Therapeutic activity, Neuromuscular re-education, Balance training, Gait training, Patient/Family education, Self Care, Joint mobilization, Joint manipulation, Stair training, Orthotic/Fit training, DME instructions, Dry Needling, Electrical stimulation, Spinal manipulation, Spinal mobilization, Cryotherapy, Moist heat, scar mobilization, Splintting, Taping, Traction, Ultrasound, Manual therapy, and Re-evaluation  PLAN FOR NEXT SESSION: complete further assessment as indicate, strengthening, stretching, manual   Lenda Kelp, PT 06/20/2022, 2:58 PM

## 2022-06-25 ENCOUNTER — Ambulatory Visit: Payer: Commercial Managed Care - PPO

## 2022-06-25 DIAGNOSIS — R262 Difficulty in walking, not elsewhere classified: Secondary | ICD-10-CM | POA: Diagnosis not present

## 2022-06-25 DIAGNOSIS — G8929 Other chronic pain: Secondary | ICD-10-CM | POA: Diagnosis not present

## 2022-06-25 DIAGNOSIS — M25562 Pain in left knee: Secondary | ICD-10-CM | POA: Diagnosis not present

## 2022-06-25 DIAGNOSIS — M222X1 Patellofemoral disorders, right knee: Secondary | ICD-10-CM | POA: Diagnosis not present

## 2022-06-25 DIAGNOSIS — M6281 Muscle weakness (generalized): Secondary | ICD-10-CM

## 2022-06-25 DIAGNOSIS — M25561 Pain in right knee: Secondary | ICD-10-CM | POA: Diagnosis not present

## 2022-06-25 DIAGNOSIS — M222X2 Patellofemoral disorders, left knee: Secondary | ICD-10-CM | POA: Diagnosis not present

## 2022-06-25 NOTE — Therapy (Signed)
OUTPATIENT PHYSICAL THERAPY LOWER EXTREMITY TREATMENT   Patient Name: Jesse Clark MRN: [id number] DOB:11/08/1985, 37 y.o., male Today's Date: 06/25/2022  END OF SESSION:  PT End of Session - 06/25/22 0942     Visit Number 3    Number of Visits 16    Date for PT Re-Evaluation 08/08/22    PT Start Time 0800    PT Stop Time 0833    PT Time Calculation (min) 33 min    Activity Tolerance Patient tolerated treatment well    Behavior During Therapy [facility] for tasks assessed/performed               Past Medical History:  Diagnosis Date   Allergy    PCN, Sulfa, Ceclor   Depression    Teenage years   Psoriasis    Past Surgical History:  Procedure Laterality Date   CHOLECYSTECTOMY  2021   TONSILLECTOMY     Patient Active Problem List   Diagnosis Date Noted   Patellofemoral syndrome, bilateral 06/06/2022   Elevated low density lipoprotein (LDL) cholesterol level 03/14/2022   Psoriasis 03/11/2022    PCP: Marjie Skiff, NP   REFERRING PROVIDER: Jerrol Banana, MD   REFERRING DIAG: M22.2X1,M22.2X2 (ICD-10-CM) - Patellofemoral syndrome, bilateral   THERAPY DIAG:  Chronic pain of right knee  Chronic pain of left knee  Muscle weakness (generalized)  Difficulty in walking, not elsewhere classified  Rationale for Evaluation and Treatment: Rehabilitation  ONSET DATE: Current episode onset 5 weeks ago March 2024, initial onset 5-6 years ago   SUBJECTIVE:   SUBJECTIVE STATEMENT:   Patient reports doing well overall- Not much pain- able to work and mow- only 1/10 bilateral patella pain  PERTINENT HISTORY: Pt is a pleasant 37 yo male presenting to PT eval for bilateral knee pain with diagnosis of bilateral PFPS per referral. Pt reports bilat knee pain on and off for past 5-6 years. 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 He reports no history of trauma, and states pain typically  occurs after "heavy use" days. He did feel as if his knees were about to "give out" on first day of symptom onset, but has not had this feeling since. He reports no swelling/inflammation. The pain has not been improving and current pain episode is lasting longer than what it typically lasts. Pt feels pain when walking and is limited to 1 hour, standing is painful after 15 minutes. Pt reports no pain when sitting currently, but can experience pain after a while. He describes the pain as a dull ache, can be sharp. Pain region is isolated to anterior patella and along patellar tendon to insertion/tibial tuberosity region. Denies posterior knee pain. Pain drops to 2/10 with seated rest. Pain reached 7-8/10 with initial onset. Average pain has been around 4/10. Prior to recent pain onset the pt was going to the gym 2x/week. He would like to return to this. He also has two young kids at home and would like to improve ease with ADLs/job duties. He is currently limited with squatting, walking, standing, stair-climbing, and other functional activities.  PMH significant for psoriasis, depression (teenage years) PAIN:  Are you having pain? Yes: NPRS scale: 1/10 Pain location: anterior patella and patellar tendon down to tibial tuberosity bilaterally Pain description: sharp, achey Aggravating factors: standing, walking, prolonged activity Relieving factors: sitting  PRECAUTIONS: None  WEIGHT BEARING RESTRICTIONS: No  FALLS:  Has patient fallen in last 6 months?  No  LIVING ENVIRONMENT: Lives with: lives with their family, lives with their spouse, and has 59 mo old and 91 year old   OCCUPATION: Buyer, retail at [employer], 12 hour shifts  PLOF: Independent  PATIENT GOALS: Get rid of pain, return to gym/activity  NEXT MD VISIT: May 8th   OBJECTIVE:   DIAGNOSTIC FINDINGS:   DG knee LEFT 06/06/2022: "IMPRESSION: Enthesopathic changes off the superior patella. No other  significant abnormalities."  DG Knee RIGHT 06/06/2022: "IMPRESSION: Enthesopathic changes off the superior patella. No other abnormalities."  PATIENT SURVEYS:  LEFS 58/80 FOTO 63 (goal 82)  COGNITION: Overall cognitive status: Within functional limits for tasks assessed     SENSATION: WFL  EDEMA:  None observed or reported  MUSCLE LENGTH: Hamstrings: Right lacking 55 deg from full ext; Left lacking 60 deg from full ext    POSTURE: No Significant postural limitations  PALPATION:  No pain with patellar glides in all planes bilaterally, pt exhibits good movement and only mild crepitus. Pt is not TTP over bilat patella, along patellar tendon bilaterally or quad tendon bilaterally   LOWER EXTREMITY MMT: MMT indicates LE strength is 5/5 for LE musculature with exception of the following: - Hip flexors 4+/5 bilaterally -Hip ER/IR 4+/5 bilat, pain-limited with ER/IR on L side -Hip extension 4/5 bliat  *knee extension was 5/5 bilaterally but mildly painful each LE  LE ROM: see muscle length testing, further assessment deferred   LOWER EXTREMITY SPECIAL TESTS:   Maisie Fus test: positive bilaterally OBER: positive bilaterally, L side tighter than R  FUNCTIONAL TESTS:  - : 1.06 m/s, observed pronation bilat LE, minimal trunk rotation.  -Single leg stance:  no hip drop observed bilaterally  - Squatting 10 reps. Pt reports onset of knee pain with 4th rep   GAIT: Distance walked: clinic distances, *see above) Assistive device utilized: None Level of assistance: Complete Independence Comments: pronation bilaterally, decreased trunk rotation, gait speed below age norm    TODAY'S TREATMENT:                                                                                                                              DATE:     Therex: (use of NON-LATEX)   - Self stretch at steps- knee flex- hold 30 sec x 3 each LE  -Self stretch at steps- Knee ext- Hold 30 sec x 3  each LE  -4 Way SLR- Supine, sidelye hip abd, sidelye hip add, and prone hip ext-10 reps bilateral  - Clamshell  -x 10 reps each LE -Seated knee flex with GTB (non-latex)  - Wall squat hold - hold 10-20 sec x 3 sets   PATIENT EDUCATION:  Education details: HEP, exercise technique  Person educated: Patient Education method: Explanation, Demonstration, Tactile cues, Verbal cues, and Handouts Education comprehension: verbalized understanding, returned demonstration, verbal cues required, tactile cues required, and needs further education  HOME EXERCISE PROGRAM:  Access Code: [email or web address] URL: [email or web address] Date: 06/25/2022  Prepared by: Maureen Ralphs  Exercises - Standing Knee Flexion Stretch on Step  - 1 x daily - 7 x weekly - 3 sets - 20-30 hold - Standing Hamstring Stretch with Step  - 1 x daily - 7 x weekly - 3 sets - 20-30 hold - Wall Squat  - 3 x weekly - 3 sets - 4 reps - 20-30 hold - Seated Hamstring Curl with Anchored Resistance  - 3 x weekly - 3 sets - 10 reps     Access Code: [email or web address] URL: [email or web address] Date: 06/20/2022 Prepared by: Maureen Ralphs  Exercises - Clamshell  - 1 x daily - 3 x weekly - 3 sets - 10 reps - Sidelying Hip Abduction  - 1 x daily - 3 x weekly - 3 sets - 10 reps - Sidelying Hip Adduction with Ankle Weight - Leg In Front  - 1 x daily - 3 x weekly - 3 sets - 10 reps       Access Code: [email or web address] URL: [email or web address] Date: 06/13/2022 Prepared by: Temple Pacini  Exercises - Seated Hamstring Stretch  - 1 x daily - 7 x weekly - 2 sets - 1 reps - 30 seconds hold - Supine Active Straight Leg Raise  - 1 x daily - 5 x weekly - 2 sets - 12 reps - 1 second hold  ASSESSMENT:  CLINICAL IMPRESSION: Patient presents with good pain report today so able to continue to add progressive LE strengthening to support the patella tracking properly. No pain reported with any of above therex  today and patient able to accept verbal cues and visual demonstration to perform all therex safely and correctly with mindset of no increased pain.  The pt will benefit from further skilled PT to improve pain, strength, gait and mobility in order to increase QOL and return to PLOF.    OBJECTIVE IMPAIRMENTS: Abnormal gait, decreased activity tolerance, decreased mobility, difficulty walking, decreased ROM, decreased strength, impaired flexibility, improper body mechanics, and pain.   ACTIVITY LIMITATIONS: lifting, sitting, standing, squatting, stairs, and locomotion level  PARTICIPATION LIMITATIONS: meal prep, cleaning, laundry, driving, shopping, community activity, occupation, and yard work  PERSONAL FACTORS: Time since onset of injury/illness/exacerbation are also affecting patient's functional outcome.   REHAB POTENTIAL: Good  CLINICAL DECISION MAKING: Stable/uncomplicated  EVALUATION COMPLEXITY: Low   GOALS: Goals reviewed with patient? Yes  SHORT TERM GOALS: Target date: 07/11/2022   Patient will be independent in home exercise program to improve strength/mobility for better functional independence with ADLs. Baseline: initiated Goal status: INITIAL  2.  The pt will report average bilateral knee pain no greater than 2/10 to indicate increased QOL. Baseline: average pain 4/10 Goal status: INITIAL   LONG TERM GOALS: Target date: 08/08/2022    Patient will be independent in home exercise program to improve strength/mobility for better functional independence with ADLs. Baseline: initiated  Goal status: INITIAL  2.  Patient will increase FOTO score to equal to or greater than  82   to demonstrate statistically significant improvement in mobility and quality of life.  Baseline: 63 Goal status: INITIAL  3.  Patient will increase 10 meter walk test to >1.3 m/s as to  indicate improve gait speed for better community ambulation and an indication that pt gait speed has returned  to age-matched norms.  Baseline: 1.06 m/s, pain-limited Goal status: INITIAL  4.  Pt will exhibit ability to squat 10x pain-free to indicate improved ease with functional movement necessary for work and home activities Baseline: pain  with 4th rep Goal status: INITIAL  5.  Patient will increase bilateral hip flexor and hip extensor strength to  5/5 as to improve functional strength for gait, and increased standing tolerance and increased ADL ability. Baseline: hip flexors 4+/5, hip extensors 4/5 bilat Goal status: INITIAL  6.  Patient will increase lower extremity functional scale by 9 points to demonstrate minimal detectable change in functional mobility and increased tolerance with ADLs.  Baseline: 58 Goal status: INITIAL  7. The patient will report average bilateral knee pain no greater than 1/10 within the past week to indicate improved functional mobility and QOL. Baseline: average 4/10 Goal status: INITIAL   PLAN:  PT FREQUENCY: 1-2x/week  PT DURATION: 8 weeks  PLANNED INTERVENTIONS: Therapeutic exercises, Therapeutic activity, Neuromuscular re-education, Balance training, Gait training, Patient/Family education, Self Care, Joint mobilization, Joint manipulation, Stair training, Orthotic/Fit training, DME instructions, Dry Needling, Electrical stimulation, Spinal manipulation, Spinal mobilization, Cryotherapy, Moist heat, scar mobilization, Splintting, Taping, Traction, Ultrasound, Manual therapy, and Re-evaluation  PLAN FOR NEXT SESSION: complete further assessment as indicate, strengthening, stretching, manual   Lenda Kelp, PT 06/25/2022, 9:44 AM

## 2022-06-27 ENCOUNTER — Encounter: Payer: Commercial Managed Care - PPO | Admitting: Physical Therapy

## 2022-06-27 ENCOUNTER — Ambulatory Visit: Payer: 59 | Admitting: Dermatology

## 2022-06-29 ENCOUNTER — Ambulatory Visit: Payer: Commercial Managed Care - PPO

## 2022-07-04 ENCOUNTER — Ambulatory Visit: Payer: Commercial Managed Care - PPO | Admitting: Family Medicine

## 2022-07-04 ENCOUNTER — Ambulatory Visit: Payer: Commercial Managed Care - PPO | Admitting: Physical Therapy

## 2022-07-06 ENCOUNTER — Ambulatory Visit (INDEPENDENT_AMBULATORY_CARE_PROVIDER_SITE_OTHER): Payer: Commercial Managed Care - PPO | Admitting: Family Medicine

## 2022-07-06 VITALS — BP 118/78 | HR 78 | Ht 69.0 in | Wt 177.0 lb

## 2022-07-06 DIAGNOSIS — M222X2 Patellofemoral disorders, left knee: Secondary | ICD-10-CM

## 2022-07-06 DIAGNOSIS — M222X1 Patellofemoral disorders, right knee: Secondary | ICD-10-CM | POA: Diagnosis not present

## 2022-07-06 NOTE — Assessment & Plan Note (Signed)
Patient returns for follow-up to bilateral knee pain with concern for patellofemoral syndrome.  He does to meloxicam x 1 week then transition to as needed dosing, performed PT and home exercises, overall with excellent progress and he is currently asymptomatic. We reviewed x-ray features together.  Given his excellent interim progress, plan as follows: - Continue home-based rehab for maintenance of symptom control, strategies reviewed - Gradually ramp up baseline physical activity in a stepwise manner using knee symptoms as a guide - Transition to as needed meloxicam - Follow-up as needed

## 2022-07-06 NOTE — Progress Notes (Signed)
     Primary Care / Sports Medicine Office Visit  Patient Information:  Patient ID: Jesse Clark, male DOB: 1985/09/09 Age: 37 y.o. MRN: [id number]   Jesse Clark is a pleasant 37 y.o. male presenting with the following:  Chief Complaint  Patient presents with   Patellofemoral syndrome, bilateral    PT has helped, no pain    Vitals:   07/06/22 1024  BP: 118/78  Pulse: 78  SpO2: 97%   Vitals:   07/06/22 1024  Weight: 177 lb (80.3 kg)  Height: 5\' 9"  (1.753 m)   Body mass index is 26.14 kg/m.  DG Knee Complete 4 Views Left  Result Date: 06/06/2022 CLINICAL DATA:  Pain EXAM: LEFT KNEE - COMPLETE 4+ VIEW COMPARISON:  None Available. FINDINGS: Oval sclerosis in the distal medial femur consist with a bone island. Probable tiny bone island in the proximal tibia as well. No fracture or dislocation. Enthesopathic changes off the superior patella. No degenerative change. No joint effusion. IMPRESSION: Enthesopathic changes off the superior patella. No other significant abnormalities. Electronically Signed   By: Gerome Sam III M.D.   On: 06/06/2022 19:51   DG Knee Complete 4 Views Right  Result Date: 06/06/2022 CLINICAL DATA:  Pain EXAM: RIGHT KNEE - COMPLETE 4+ VIEW COMPARISON:  None Available. FINDINGS: Enthesopathic changes off the superior patella. No degenerative change. No joint effusion. No other abnormality. IMPRESSION: Enthesopathic changes off the superior patella. No other abnormalities. Electronically Signed   By: Gerome Sam III M.D.   On: 06/06/2022 19:50     Independent interpretation of notes and tests performed by another provider:   Independent interpretation of bilateral knee x-rays dated 06/06/2022 demonstrates maintained joint space throughout, there is noted left greater than right superior patellar enthesophytes, subtle lateral patellar facet osteophyte bilaterally, no acute osseous processes identified  Procedures performed:   None  Pertinent  History, Exam, Impression, and Recommendations:   Jesse Clark was seen today for patellofemoral syndrome, bilateral.  Patellofemoral syndrome, bilateral Assessment & Plan: Patient returns for follow-up to bilateral knee pain with concern for patellofemoral syndrome.  He does to meloxicam x 1 week then transition to as needed dosing, performed PT and home exercises, overall with excellent progress and he is currently asymptomatic. We reviewed x-ray features together.  Given his excellent interim progress, plan as follows: - Continue home-based rehab for maintenance of symptom control, strategies reviewed - Gradually ramp up baseline physical activity in a stepwise manner using knee symptoms as a guide - Transition to as needed meloxicam - Follow-up as needed      Orders & Medications No orders of the defined types were placed in this encounter.  No orders of the defined types were placed in this encounter.    No follow-ups on file.     Jerrol Banana, MD, [facility]   Primary Care Sports Medicine Primary Care and Sports Medicine at [facility]

## 2022-07-06 NOTE — Patient Instructions (Signed)
-   Continue home-based rehab for maintenance of symptom control - Gradually ramp up baseline physical activity in a stepwise manner using knee symptoms as a guide - Transition to as needed dosing of meloxicam (take with food) - Follow-up as needed

## 2022-07-26 ENCOUNTER — Encounter: Payer: Commercial Managed Care - PPO | Admitting: Physical Therapy

## 2022-08-14 ENCOUNTER — Encounter: Payer: Commercial Managed Care - PPO | Admitting: Physical Therapy

## 2022-09-07 ENCOUNTER — Other Ambulatory Visit: Payer: Self-pay | Admitting: Oncology

## 2022-09-07 DIAGNOSIS — Z006 Encounter for examination for normal comparison and control in clinical research program: Secondary | ICD-10-CM

## 2022-09-20 ENCOUNTER — Encounter: Payer: Self-pay | Admitting: Nurse Practitioner

## 2022-09-20 ENCOUNTER — Other Ambulatory Visit: Payer: Self-pay

## 2022-09-20 ENCOUNTER — Ambulatory Visit: Payer: Commercial Managed Care - PPO | Admitting: Nurse Practitioner

## 2022-09-20 VITALS — BP 115/70 | HR 71 | Temp 98.1°F | Wt 185.2 lb

## 2022-09-20 DIAGNOSIS — G43009 Migraine without aura, not intractable, without status migrainosus: Secondary | ICD-10-CM | POA: Diagnosis not present

## 2022-09-20 DIAGNOSIS — G43909 Migraine, unspecified, not intractable, without status migrainosus: Secondary | ICD-10-CM | POA: Insufficient documentation

## 2022-09-20 MED ORDER — AMITRIPTYLINE HCL 10 MG PO TABS
10.0000 mg | ORAL_TABLET | Freq: Every day | ORAL | 2 refills | Status: DC
  Filled 2022-09-20: qty 30, 30d supply, fill #0
  Filled 2022-10-15: qty 30, 30d supply, fill #1

## 2022-09-20 MED ORDER — RIZATRIPTAN BENZOATE 10 MG PO TABS
10.0000 mg | ORAL_TABLET | ORAL | 4 refills | Status: DC | PRN
  Filled 2022-09-20: qty 20, 23d supply, fill #0

## 2022-09-20 NOTE — Assessment & Plan Note (Signed)
Ongoing for 6 years, had treated on own at home for years.  Has never seen neurology.  No red flags on neuro exam.  At this time will start out with Amitriptyline 10 MG nightly, which may benefit migraines and sleep.  Does a 35 month old infant and sleep is interrupted at this time, which suspect is triggering migraines.  Add on Maxalt to take as needed.  Discussed this plan of care and medications, educated on all.  He is in agreeance with plan of care.  Return in 4 weeks.

## 2022-09-20 NOTE — Patient Instructions (Signed)
Migraine Headache A migraine headache is a very strong throbbing pain on one or both sides of your head. This type of headache can also cause other symptoms. It can last from 4 hours to 3 days. Talk with your doctor about what things may bring on (trigger) this condition. What are the causes? The exact cause of a migraine is not known. This condition may be brought on or caused by: Smoking. Medicines, such as: Medicine used to treat chest pain (nitroglycerin). Birth control pills. Estrogen. Some blood pressure medicines. Certain substances in some foods or drinks. Foods and drinks, such as: Cheese. Chocolate. Alcohol. Caffeine. Doing physical activity that is very hard. Other things that may trigger a migraine headache include: Periods. Pregnancy. Hunger. Stress. Getting too much or too little sleep. Weather changes. Feeling tired (fatigue). What increases the risk? Being 25-55 years old. Being male. Having a family history of migraine headaches. Being Caucasian. Having a mental health condition, such as being sad (depressed) or feeling worried or nervous (anxious). Being very overweight (obese). What are the signs or symptoms? A throbbing pain. This pain may: Happen in any area of the head, such as on one or both sides. Make it hard to do daily activities. Get worse with physical activity. Get worse around bright lights, loud noises, or smells. Other symptoms may include: Feeling like you may vomit (nauseous). Vomiting. Dizziness. Before a migraine headache starts, you may get warning signs (an aura). An aura may include: Seeing flashing lights or having blind spots. Seeing bright spots, halos, or zigzag lines. Having tunnel vision or blurred vision. Having numbness or a tingling feeling. Having trouble talking. Having weak muscles. After a migraine ends, you may have symptoms. These may include: Tiredness. Trouble thinking (concentrating). How is this  treated? Taking medicines that: Relieve pain. Relieve the feeling like you may vomit. Prevent migraine headaches. Treatment may also include: Acupuncture. Lifestyle changes like avoiding foods that bring on migraine headaches. Learning ways to control your body functions (biofeedback). Therapy to help you know and deal with negative thoughts (cognitive behavioral therapy). Follow these instructions at home: Medicines Take over-the-counter and prescription medicines only as told by your doctor. If told, take steps to prevent problems with pooping (constipation). You may need to: Drink enough fluid to keep your pee (urine) pale yellow. Take medicines. You will be told what medicines to take. Eat foods that are high in fiber. These include beans, whole grains, and fresh fruits and vegetables. Limit foods that are high in fat and sugar. These include fried or sweet foods. Ask your doctor if you should avoid driving or using machines while you are taking your medicine. Lifestyle  Do not drink alcohol. Do not smoke or use any products that contain nicotine or tobacco. If you need help quitting, ask your doctor. Get 7-9 hours of sleep each night, or the amount recommended by your doctor. Find ways to deal with stress, such as meditation, deep breathing, or yoga. Try to exercise often. This can help lessen how bad and how often your migraines happen. General instructions Keep a journal to find out what may bring on your migraine headaches. This can help you avoid those things. For example, write down: What you eat and drink. How much sleep you get. Any change to your medicines or diet. If you have a migraine headache: Avoid things that make your symptoms worse, such as bright lights. Lie down in a dark, quiet room. Do not drive or use machinery. Ask your   doctor what activities are safe for you. Where to find more information Coalition for Headache and Migraine Patients (CHAMP):  headachemigraine.org American Migraine Foundation: americanmigrainefoundation.org National Headache Foundation: headaches.org Contact a doctor if: You get a migraine headache that is different or worse than others you have had. You have more than 15 days of headaches in one month. Get help right away if: Your migraine headache gets very bad. Your migraine headache lasts more than 72 hours. You have a fever or stiff neck. You have trouble seeing. Your muscles feel weak or like you cannot control them. You lose your balance a lot. You have trouble walking. You faint. You have a seizure. This information is not intended to replace advice given to you by your health care provider. Make sure you discuss any questions you have with your health care provider. Document Revised: 10/09/2021 Document Reviewed: 10/09/2021 Elsevier Patient Education  2024 [employer].  

## 2022-09-20 NOTE — Progress Notes (Signed)
BP 115/70   Pulse 71   Temp 98.1 F (36.7 C) (Oral)   Wt 185 lb 3.2 oz (84 kg)   SpO2 99%   BMI 27.35 kg/m    Subjective:    Patient ID: Jesse Clark, male    DOB: 01-14-1986, 37 y.o.   MRN: [id number]  HPI: Jesse Clark is a 37 y.o. male  Chief Complaint  Patient presents with   Migraine    Pt states he has been dealing with Migraines for the last 6 years. States they have gotten worse within the last 3 months, having them more frequently and for longer periods of time. States he has taken ibuprofen, Excedrin in the past. States they used to help but are no longer helping. States that he has never taken any prescription medications for headaches or migraines in the past.    MIGRAINES Has had for 6 years and treated on own at home.  Was able to take Ibuprofen and Excedrin, which is no longer helper.  Within the last 3 months they have become worse.  Has never taken prescription medications for or been evaluated.  Last migraine was on Sunday, got up at 3 am to feed baby and migraine was present -- went back to sleep.  Woke up at 7 am and took Excedrin and used ice pack with no benefit.  Currently gets 1-2 a week.  Used to get one a month in past. Duration: years Onset: gradual Severity: 7/10 Quality: sharp, aching, sore, and throbbing Frequency: intermittent Location: behind right eye and right side of occiput Headache duration: 6 to 8 hours -- effects full day Radiation: no Time of day headache occurs: morning they will start Alleviating factors: nothing Aggravating factors: sleep disturbances or poor sleep, over exertion, random foods -- Bratwurst, processed meats Headache status at time of visit: asymptomatic Treatments attempted: Treatments attempted: rest, hydration, Excedrin and Ibuprofen Aura: no Nausea:  yes Vomiting: no Photophobia:  no Phonophobia:  yes Effect on social functioning:  yes Numbers of missed days of school/work each month: 0 Confusion:   no Gait disturbance/ataxia:  no Behavioral changes:  no Fevers:  no   Relevant past medical, surgical, family and social history reviewed and updated as indicated. Interim medical history since our last visit reviewed. Allergies and medications reviewed and updated.  Review of Systems  Constitutional:  Negative for activity change, diaphoresis, fatigue and fever.  Respiratory:  Negative for cough, chest tightness, shortness of breath and wheezing.   Cardiovascular:  Negative for chest pain, palpitations and leg swelling.  Gastrointestinal: Negative.   Neurological:  Positive for headaches. Negative for dizziness, syncope, weakness, light-headedness and numbness.  Psychiatric/Behavioral: Negative.     Per HPI unless specifically indicated above     Objective:    BP 115/70   Pulse 71   Temp 98.1 F (36.7 C) (Oral)   Wt 185 lb 3.2 oz (84 kg)   SpO2 99%   BMI 27.35 kg/m   Wt Readings from Last 3 Encounters:  09/20/22 185 lb 3.2 oz (84 kg)  07/06/22 177 lb (80.3 kg)  06/06/22 186 lb (84.4 kg)    Physical Exam Vitals and nursing note reviewed.  Constitutional:      General: He is awake. He is not in acute distress.    Appearance: He is well-developed and well-groomed. He is not ill-appearing or toxic-appearing.  HENT:     Head: Normocephalic.     Right Ear: Hearing and external ear normal.  Left Ear: Hearing and external ear normal.     Nose: Nose normal.     Right Sinus: No maxillary sinus tenderness or frontal sinus tenderness.     Left Sinus: No maxillary sinus tenderness or frontal sinus tenderness.  Eyes:     General: Lids are normal.     Extraocular Movements: Extraocular movements intact.     Conjunctiva/sclera: Conjunctivae normal.  Neck:     Thyroid: No thyromegaly.     Vascular: No carotid bruit.  Cardiovascular:     Rate and Rhythm: Normal rate and regular rhythm.     Heart sounds: Normal heart sounds. No murmur heard.    No gallop.  Pulmonary:      Effort: Pulmonary effort is normal. No accessory muscle usage or respiratory distress.     Breath sounds: Normal breath sounds.  Abdominal:     General: Bowel sounds are normal. There is no distension.     Palpations: Abdomen is soft.     Tenderness: There is no abdominal tenderness.  Musculoskeletal:     Cervical back: Full passive range of motion without pain.     Right lower leg: No edema.     Left lower leg: No edema.  Lymphadenopathy:     Cervical: No cervical adenopathy.  Skin:    General: Skin is warm.     Capillary Refill: Capillary refill takes less than 2 seconds.  Neurological:     Mental Status: He is alert and oriented to person, place, and time.     Cranial Nerves: Cranial nerves 2-12 are intact.     Motor: Motor function is intact.     Coordination: Coordination is intact.     Gait: Gait is intact.     Deep Tendon Reflexes: Reflexes are normal and symmetric.     Reflex Scores:      Brachioradialis reflexes are 2+ on the right side and 2+ on the left side.      Patellar reflexes are 2+ on the right side and 2+ on the left side. Psychiatric:        Attention and Perception: Attention normal.        Mood and Affect: Mood normal.        Speech: Speech normal.        Behavior: Behavior normal. Behavior is cooperative.        Thought Content: Thought content normal.    Results for orders placed or performed in visit on 03/13/22  CBC with Differential/Platelet  Result Value Ref Range   WBC 7.7 3.4 - 10.8 x10E3/uL   RBC 5.62 4.14 - 5.80 x10E6/uL   Hemoglobin 16.2 13.0 - 17.7 g/dL   Hematocrit 16.1 09.6 - 51.0 %   MCV 83 79 - 97 fL   MCH 28.8 26.6 - 33.0 pg   MCHC 34.8 31.5 - 35.7 g/dL   RDW 04.5 40.9 - 81.1 %   Platelets 221 150 - 450 x10E3/uL   Neutrophils 64 Not Estab. %   Lymphs 26 Not Estab. %   Monocytes 7 Not Estab. %   Eos 2 Not Estab. %   Basos 1 Not Estab. %   Neutrophils Absolute 4.9 1.4 - 7.0 x10E3/uL   Lymphocytes Absolute 2.0 0.7 - 3.1 x10E3/uL    Monocytes Absolute 0.5 0.1 - 0.9 x10E3/uL   EOS (ABSOLUTE) 0.1 0.0 - 0.4 x10E3/uL   Basophils Absolute 0.1 0.0 - 0.2 x10E3/uL   Immature Granulocytes 0 Not Estab. %   Immature Grans (Abs)  0.0 0.0 - 0.1 x10E3/uL  Comprehensive metabolic panel  Result Value Ref Range   Glucose 81 70 - 99 mg/dL   BUN 14 6 - 20 mg/dL   Creatinine, Ser 5.18 0.76 - 1.27 mg/dL   eGFR 841 >66 AY/TKZ/6.01   BUN/Creatinine Ratio 15 9 - 20   Sodium 142 134 - 144 mmol/L   Potassium 4.4 3.5 - 5.2 mmol/L   Chloride 99 96 - 106 mmol/L   CO2 25 20 - 29 mmol/L   Calcium 9.7 8.7 - 10.2 mg/dL   Total Protein 7.3 6.0 - 8.5 g/dL   Albumin 4.9 4.1 - 5.1 g/dL   Globulin, Total 2.4 1.5 - 4.5 g/dL   Albumin/Globulin Ratio 2.0 1.2 - 2.2   Bilirubin Total 0.5 0.0 - 1.2 mg/dL   Alkaline Phosphatase 66 44 - 121 IU/L   AST 23 0 - 40 IU/L   ALT 16 0 - 44 IU/L  Lipid Panel w/o Chol/HDL Ratio  Result Value Ref Range   Cholesterol, Total 228 (H) 100 - 199 mg/dL   Triglycerides 093 0 - 149 mg/dL   HDL 88 >23 mg/dL   VLDL Cholesterol Cal 22 5 - 40 mg/dL   LDL Chol Calc (NIH) 557 (H) 0 - 99 mg/dL  TSH  Result Value Ref Range   TSH 1.060 0.450 - 4.500 uIU/mL  Hepatitis C antibody  Result Value Ref Range   Hep C Virus Ab Non Reactive Non Reactive  HIV Antibody (routine testing w rflx)  Result Value Ref Range   HIV Screen 4th Generation wRfx Non Reactive Non Reactive      Assessment & Plan:   Problem List Items Addressed This Visit       Cardiovascular and Mediastinum   Migraine - Primary    Ongoing for 6 years, had treated on own at home for years.  Has never seen neurology.  No red flags on neuro exam.  At this time will start out with Amitriptyline 10 MG nightly, which may benefit migraines and sleep.  Does a 71 month old infant and sleep is interrupted at this time, which suspect is triggering migraines.  Add on Maxalt to take as needed.  Discussed this plan of care and medications, educated on all.  He is in  agreeance with plan of care.  Return in 4 weeks.      Relevant Medications   amitriptyline (ELAVIL) 10 MG tablet   rizatriptan (MAXALT) 10 MG tablet     Follow up plan: Return in about 4 weeks (around 10/18/2022) for Migraines.

## 2022-09-25 ENCOUNTER — Ambulatory Visit: Payer: Commercial Managed Care - PPO | Admitting: Nurse Practitioner

## 2022-10-04 ENCOUNTER — Encounter: Payer: Commercial Managed Care - PPO | Admitting: Physical Therapy

## 2022-10-14 NOTE — Patient Instructions (Signed)
Migraine Headache A migraine headache is a very strong throbbing pain on one or both sides of your head. This type of headache can also cause other symptoms. It can last from 4 hours to 3 days. Talk with your doctor about what things may bring on (trigger) this condition. What are the causes? The exact cause of a migraine is not known. This condition may be brought on or caused by: Smoking. Medicines, such as: Medicine used to treat chest pain (nitroglycerin). Birth control pills. Estrogen. Some blood pressure medicines. Certain substances in some foods or drinks. Foods and drinks, such as: Cheese. Chocolate. Alcohol. Caffeine. Doing physical activity that is very hard. Other things that may trigger a migraine headache include: Periods. Pregnancy. Hunger. Stress. Getting too much or too little sleep. Weather changes. Feeling tired (fatigue). What increases the risk? Being 25-55 years old. Being male. Having a family history of migraine headaches. Being Caucasian. Having a mental health condition, such as being sad (depressed) or feeling worried or nervous (anxious). Being very overweight (obese). What are the signs or symptoms? A throbbing pain. This pain may: Happen in any area of the head, such as on one or both sides. Make it hard to do daily activities. Get worse with physical activity. Get worse around bright lights, loud noises, or smells. Other symptoms may include: Feeling like you may vomit (nauseous). Vomiting. Dizziness. Before a migraine headache starts, you may get warning signs (an aura). An aura may include: Seeing flashing lights or having blind spots. Seeing bright spots, halos, or zigzag lines. Having tunnel vision or blurred vision. Having numbness or a tingling feeling. Having trouble talking. Having weak muscles. After a migraine ends, you may have symptoms. These may include: Tiredness. Trouble thinking (concentrating). How is this  treated? Taking medicines that: Relieve pain. Relieve the feeling like you may vomit. Prevent migraine headaches. Treatment may also include: Acupuncture. Lifestyle changes like avoiding foods that bring on migraine headaches. Learning ways to control your body functions (biofeedback). Therapy to help you know and deal with negative thoughts (cognitive behavioral therapy). Follow these instructions at home: Medicines Take over-the-counter and prescription medicines only as told by your doctor. If told, take steps to prevent problems with pooping (constipation). You may need to: Drink enough fluid to keep your pee (urine) pale yellow. Take medicines. You will be told what medicines to take. Eat foods that are high in fiber. These include beans, whole grains, and fresh fruits and vegetables. Limit foods that are high in fat and sugar. These include fried or sweet foods. Ask your doctor if you should avoid driving or using machines while you are taking your medicine. Lifestyle  Do not drink alcohol. Do not smoke or use any products that contain nicotine or tobacco. If you need help quitting, ask your doctor. Get 7-9 hours of sleep each night, or the amount recommended by your doctor. Find ways to deal with stress, such as meditation, deep breathing, or yoga. Try to exercise often. This can help lessen how bad and how often your migraines happen. General instructions Keep a journal to find out what may bring on your migraine headaches. This can help you avoid those things. For example, write down: What you eat and drink. How much sleep you get. Any change to your medicines or diet. If you have a migraine headache: Avoid things that make your symptoms worse, such as bright lights. Lie down in a dark, quiet room. Do not drive or use machinery. Ask your   doctor what activities are safe for you. Where to find more information Coalition for Headache and Migraine Patients (CHAMP):  headachemigraine.org American Migraine Foundation: americanmigrainefoundation.org National Headache Foundation: headaches.org Contact a doctor if: You get a migraine headache that is different or worse than others you have had. You have more than 15 days of headaches in one month. Get help right away if: Your migraine headache gets very bad. Your migraine headache lasts more than 72 hours. You have a fever or stiff neck. You have trouble seeing. Your muscles feel weak or like you cannot control them. You lose your balance a lot. You have trouble walking. You faint. You have a seizure. This information is not intended to replace advice given to you by your health care provider. Make sure you discuss any questions you have with your health care provider. Document Revised: 10/09/2021 Document Reviewed: 10/09/2021 Elsevier Patient Education  2024 [employer].  

## 2022-10-19 ENCOUNTER — Ambulatory Visit: Payer: Commercial Managed Care - PPO | Admitting: Nurse Practitioner

## 2022-10-19 ENCOUNTER — Encounter: Payer: Self-pay | Admitting: Nurse Practitioner

## 2022-10-19 ENCOUNTER — Other Ambulatory Visit: Payer: Self-pay

## 2022-10-19 VITALS — BP 128/70 | HR 76 | Temp 97.8°F | Resp 18 | Ht 69.0 in | Wt 183.4 lb

## 2022-10-19 DIAGNOSIS — G43009 Migraine without aura, not intractable, without status migrainosus: Secondary | ICD-10-CM | POA: Diagnosis not present

## 2022-10-19 MED ORDER — AMITRIPTYLINE HCL 10 MG PO TABS
10.0000 mg | ORAL_TABLET | Freq: Every day | ORAL | 4 refills | Status: DC
  Filled 2022-10-19 – 2022-11-13 (×2): qty 90, 90d supply, fill #0
  Filled 2023-02-12: qty 90, 90d supply, fill #1

## 2022-10-19 NOTE — Progress Notes (Signed)
BP 128/70 (BP Location: Left Arm, Patient Position: Sitting, Cuff Size: Normal)   Pulse 76   Temp 97.8 F (36.6 C) (Oral)   Resp 18   Ht 5\' 9"  (1.753 m)   Wt 183 lb 6.4 oz (83.2 kg)   SpO2 98%   BMI 27.08 kg/m    Subjective:    Patient ID: Jesse Clark, male    DOB: 01/20/1986, 37 y.o.   MRN: [id number]  HPI: Jesse Clark is a 37 y.o. male  Chief Complaint  Patient presents with   Migraine   MIGRAINES Last visit started on Amitriptyline 10 MG, has gone from 1-2 migraines a week to having 1 since last visit.  Did use Maxalt for the acute migraine, did get vertigo with this as could not lie down immediately.  Has had migraines for 6 years and treated on own at home.  Duration: years Onset: gradual Severity: 6/10 last migraine, was in late afternoon Quality: sharp, aching, and throbbing Frequency: intermittent Location: behind right eye and right side of occiput Headache duration: 6 to 8 hours  Radiation: no Time of day headache occurs: often mornings they start, but recent was afternoon Alleviating factors: nothing Aggravating factors: sleep disturbances or poor sleep, over exertion, random foods -- Bratwurst, processed meats Headache status at time of visit: asymptomatic Treatments attempted: rest, hydration, Excedrin and Ibuprofen Aura: no Nausea:  yes Vomiting: no Photophobia:  no Phonophobia:  yes Effect on social functioning:  yes Numbers of missed days of school/work each month: 0 Confusion:  no Gait disturbance/ataxia:  no Behavioral changes:  no Fevers:  no   Relevant past medical, surgical, family and social history reviewed and updated as indicated. Interim medical history since our last visit reviewed. Allergies and medications reviewed and updated.  Review of Systems  Constitutional:  Negative for activity change, diaphoresis, fatigue and fever.  Respiratory:  Negative for cough, chest tightness, shortness of breath and wheezing.    Cardiovascular:  Negative for chest pain, palpitations and leg swelling.  Gastrointestinal: Negative.   Neurological:  Positive for headaches. Negative for dizziness, syncope, weakness, light-headedness and numbness.  Psychiatric/Behavioral: Negative.     Per HPI unless specifically indicated above     Objective:    BP 128/70 (BP Location: Left Arm, Patient Position: Sitting, Cuff Size: Normal)   Pulse 76   Temp 97.8 F (36.6 C) (Oral)   Resp 18   Ht 5\' 9"  (1.753 m)   Wt 183 lb 6.4 oz (83.2 kg)   SpO2 98%   BMI 27.08 kg/m   Wt Readings from Last 3 Encounters:  10/19/22 183 lb 6.4 oz (83.2 kg)  09/20/22 185 lb 3.2 oz (84 kg)  07/06/22 177 lb (80.3 kg)    Physical Exam Vitals and nursing note reviewed.  Constitutional:      General: He is awake. He is not in acute distress.    Appearance: He is well-developed and well-groomed. He is not ill-appearing or toxic-appearing.  HENT:     Head: Normocephalic.     Right Ear: Hearing and external ear normal.     Left Ear: Hearing and external ear normal.     Nose: Nose normal.     Right Sinus: No maxillary sinus tenderness or frontal sinus tenderness.     Left Sinus: No maxillary sinus tenderness or frontal sinus tenderness.  Eyes:     General: Lids are normal.     Extraocular Movements: Extraocular movements intact.     Conjunctiva/sclera: Conjunctivae  normal.  Neck:     Thyroid: No thyromegaly.     Vascular: No carotid bruit.  Cardiovascular:     Rate and Rhythm: Normal rate and regular rhythm.     Heart sounds: Normal heart sounds. No murmur heard.    No gallop.  Pulmonary:     Effort: Pulmonary effort is normal. No accessory muscle usage or respiratory distress.     Breath sounds: Normal breath sounds.  Abdominal:     General: Bowel sounds are normal. There is no distension.     Palpations: Abdomen is soft.     Tenderness: There is no abdominal tenderness.  Musculoskeletal:     Cervical back: Full passive range of  motion without pain.     Right lower leg: No edema.     Left lower leg: No edema.  Lymphadenopathy:     Cervical: No cervical adenopathy.  Skin:    General: Skin is warm.     Capillary Refill: Capillary refill takes less than 2 seconds.  Neurological:     Mental Status: He is alert and oriented to person, place, and time.     Cranial Nerves: Cranial nerves 2-12 are intact.     Motor: Motor function is intact.     Coordination: Coordination is intact.     Gait: Gait is intact.     Deep Tendon Reflexes: Reflexes are normal and symmetric.     Reflex Scores:      Brachioradialis reflexes are 2+ on the right side and 2+ on the left side.      Patellar reflexes are 2+ on the right side and 2+ on the left side. Psychiatric:        Attention and Perception: Attention normal.        Mood and Affect: Mood normal.        Speech: Speech normal.        Behavior: Behavior normal. Behavior is cooperative.        Thought Content: Thought content normal.    Results for orders placed or performed in visit on 03/13/22  CBC with Differential/Platelet  Result Value Ref Range   WBC 7.7 3.4 - 10.8 x10E3/uL   RBC 5.62 4.14 - 5.80 x10E6/uL   Hemoglobin 16.2 13.0 - 17.7 g/dL   Hematocrit 52.8 41.3 - 51.0 %   MCV 83 79 - 97 fL   MCH 28.8 26.6 - 33.0 pg   MCHC 34.8 31.5 - 35.7 g/dL   RDW 24.4 01.0 - 27.2 %   Platelets 221 150 - 450 x10E3/uL   Neutrophils 64 Not Estab. %   Lymphs 26 Not Estab. %   Monocytes 7 Not Estab. %   Eos 2 Not Estab. %   Basos 1 Not Estab. %   Neutrophils Absolute 4.9 1.4 - 7.0 x10E3/uL   Lymphocytes Absolute 2.0 0.7 - 3.1 x10E3/uL   Monocytes Absolute 0.5 0.1 - 0.9 x10E3/uL   EOS (ABSOLUTE) 0.1 0.0 - 0.4 x10E3/uL   Basophils Absolute 0.1 0.0 - 0.2 x10E3/uL   Immature Granulocytes 0 Not Estab. %   Immature Grans (Abs) 0.0 0.0 - 0.1 x10E3/uL  Comprehensive metabolic panel  Result Value Ref Range   Glucose 81 70 - 99 mg/dL   BUN 14 6 - 20 mg/dL   Creatinine, Ser 5.36  0.76 - 1.27 mg/dL   eGFR 644 >03 KV/QQV/9.56   BUN/Creatinine Ratio 15 9 - 20   Sodium 142 134 - 144 mmol/L   Potassium 4.4 3.5 -  5.2 mmol/L   Chloride 99 96 - 106 mmol/L   CO2 25 20 - 29 mmol/L   Calcium 9.7 8.7 - 10.2 mg/dL   Total Protein 7.3 6.0 - 8.5 g/dL   Albumin 4.9 4.1 - 5.1 g/dL   Globulin, Total 2.4 1.5 - 4.5 g/dL   Albumin/Globulin Ratio 2.0 1.2 - 2.2   Bilirubin Total 0.5 0.0 - 1.2 mg/dL   Alkaline Phosphatase 66 44 - 121 IU/L   AST 23 0 - 40 IU/L   ALT 16 0 - 44 IU/L  Lipid Panel w/o Chol/HDL Ratio  Result Value Ref Range   Cholesterol, Total 228 (H) 100 - 199 mg/dL   Triglycerides 213 0 - 149 mg/dL   HDL 88 >08 mg/dL   VLDL Cholesterol Cal 22 5 - 40 mg/dL   LDL Chol Calc (NIH) 657 (H) 0 - 99 mg/dL  TSH  Result Value Ref Range   TSH 1.060 0.450 - 4.500 uIU/mL  Hepatitis C antibody  Result Value Ref Range   Hep C Virus Ab Non Reactive Non Reactive  HIV Antibody (routine testing w rflx)  Result Value Ref Range   HIV Screen 4th Generation wRfx Non Reactive Non Reactive      Assessment & Plan:   Problem List Items Addressed This Visit       Cardiovascular and Mediastinum   Migraine - Primary    Ongoing for 6 years, had treated on own at home for years.  Has never seen neurology.  No red flags on neuro exam.  At this time migraines have vastly improved with Amitriptyline + sleep improved.  Continue Amitriptyline 10 MG nightly and Maxalt as needed. Discussed this plan of care and medications, educated on all.  He is in agreeance with plan of care.  Return in 6 months.      Relevant Medications   amitriptyline (ELAVIL) 10 MG tablet     Follow up plan: Return for as schedule January 17th for physical.

## 2022-10-19 NOTE — Assessment & Plan Note (Signed)
Ongoing for 6 years, had treated on own at home for years.  Has never seen neurology.  No red flags on neuro exam.  At this time migraines have vastly improved with Amitriptyline + sleep improved.  Continue Amitriptyline 10 MG nightly and Maxalt as needed. Discussed this plan of care and medications, educated on all.  He is in agreeance with plan of care.  Return in 6 months.

## 2022-11-13 ENCOUNTER — Other Ambulatory Visit: Payer: Self-pay

## 2023-03-09 NOTE — Patient Instructions (Signed)
Be Involved in Caring For Your Health:  Taking Medications When medications are taken as directed, they can greatly improve your health. But if they are not taken as prescribed, they may not work. In some cases, not taking them correctly can be harmful. To help ensure your treatment remains effective and safe, understand your medications and how to take them. Bring your medications to each visit for review by your provider.  Your lab results, notes, and after visit summary will be available on My Chart. We strongly encourage you to use this feature. If lab results are abnormal the clinic will contact you with the appropriate steps. If the clinic does not contact you assume the results are satisfactory. You can always view your results on My Chart. If you have questions regarding your health or results, please contact the clinic during office hours. You can also ask questions on My Chart.  We at [facility] are grateful that you chose [place] to provide your care. We strive to provide evidence-based and compassionate care and are always looking for feedback. If you get a survey from the clinic please complete this so we can hear your opinions.  Healthy Eating, Adult Healthy eating may help you get and keep a healthy body weight, reduce the risk of chronic disease, and live a long and productive life. It is important to follow a healthy eating pattern. Your nutritional and calorie needs should be met mainly by different nutrient-rich foods. What are tips for following this plan? Reading food labels Read labels and choose the following: Reduced or low sodium products. Juices with 100% fruit juice. Foods with low saturated fats (<3 g per serving) and high polyunsaturated and monounsaturated fats. Foods with whole grains, such as whole wheat, cracked wheat, brown rice, and wild rice. Whole grains that are fortified with folic acid. This is recommended for females who are pregnant or who want to  become pregnant. Read labels and do not eat or drink the following: Foods or drinks with added sugars. These include foods that contain brown sugar, corn sweetener, corn syrup, dextrose, fructose, glucose, high-fructose corn syrup, honey, invert sugar, lactose, malt syrup, maltose, molasses, raw sugar, sucrose, trehalose, or turbinado sugar. Limit your intake of added sugars to less than 10% of your total daily calories. Do not eat more than the following amounts of added sugar per day: 6 teaspoons (25 g) for females. 9 teaspoons (38 g) for males. Foods that contain processed or refined starches and grains. Refined grain products, such as white flour, degermed cornmeal, white bread, and white rice. Shopping Choose nutrient-rich snacks, such as vegetables, whole fruits, and nuts. Avoid high-calorie and high-sugar snacks, such as potato chips, fruit snacks, and candy. Use oil-based dressings and spreads on foods instead of solid fats such as butter, margarine, sour cream, or cream cheese. Limit pre-made sauces, mixes, and "instant" products such as flavored rice, instant noodles, and ready-made pasta. Try more plant-protein sources, such as tofu, tempeh, black beans, edamame, lentils, nuts, and seeds. Explore eating plans such as the Mediterranean diet or vegetarian diet. Try heart-healthy dips made with beans and healthy fats like hummus and guacamole. Vegetables go great with these. Cooking Use oil to saut or stir-fry foods instead of solid fats such as butter, margarine, or lard. Try baking, boiling, grilling, or broiling instead of frying. Remove the fatty part of meats before cooking. Steam vegetables in water or broth. Meal planning  At meals, imagine dividing your plate into fourths: One-half of  your plate is fruits and vegetables. One-fourth of your plate is whole grains. One-fourth of your plate is protein, especially lean meats, poultry, eggs, tofu, beans, or nuts. Include low-fat  dairy as part of your daily diet. Lifestyle Choose healthy options in all settings, including home, work, school, restaurants, or stores. Prepare your food safely: Wash your hands after handling raw meats. Where you prepare food, keep surfaces clean by regularly washing with hot, soapy water. Keep raw meats separate from ready-to-eat foods, such as fruits and vegetables. Cook seafood, meat, poultry, and eggs to the recommended temperature. Get a food thermometer. Store foods at safe temperatures. In general: Keep cold foods at 48F (4.4C) or below. Keep hot foods at 148F (60C) or above. Keep your freezer at [facility] (-17.8C) or below. Foods are not safe to eat if they have been between the temperatures of 40-148F (4.4-60C) for more than 2 hours. What foods should I eat? Fruits Aim to eat 1-2 cups of fresh, canned (in natural juice), or frozen fruits each day. One cup of fruit equals 1 small apple, 1 large banana, 8 large strawberries, 1 cup (237 g) canned fruit,  cup (82 g) dried fruit, or 1 cup (240 mL) 100% juice. Vegetables Aim to eat 2-4 cups of fresh and frozen vegetables each day, including different varieties and colors. One cup of vegetables equals 1 cup (91 g) broccoli or cauliflower florets, 2 medium carrots, 2 cups (150 g) raw, leafy greens, 1 large tomato, 1 large bell pepper, 1 large sweet potato, or 1 medium white potato. Grains Aim to eat 5-10 ounce-equivalents of whole grains each day. Examples of 1 ounce-equivalent of grains include 1 slice of bread, 1 cup (40 g) ready-to-eat cereal, 3 cups (24 g) popcorn, or  cup (93 g) cooked rice. Meats and other proteins Try to eat 5-7 ounce-equivalents of protein each day. Examples of 1 ounce-equivalent of protein include 1 egg,  oz nuts (12 almonds, 24 pistachios, or 7 walnut halves), 1/4 cup (90 g) cooked beans, 6 tablespoons (90 g) hummus or 1 tablespoon (16 g) peanut butter. A cut of meat or fish that is the size of a deck of  cards is about 3-4 ounce-equivalents (85 g). Of the protein you eat each week, try to have at least 8 sounce (227 g) of seafood. This is about 2 servings per week. This includes salmon, trout, herring, sardines, and anchovies. Dairy Aim to eat 3 cup-equivalents of fat-free or low-fat dairy each day. Examples of 1 cup-equivalent of dairy include 1 cup (240 mL) milk, 8 ounces (250 g) yogurt, 1 ounces (44 g) natural cheese, or 1 cup (240 mL) fortified soy milk. Fats and oils Aim for about 5 teaspoons (21 g) of fats and oils per day. Choose monounsaturated fats, such as canola and olive oils, mayonnaise made with olive oil or avocado oil, avocados, peanut butter, and most nuts, or polyunsaturated fats, such as sunflower, corn, and soybean oils, walnuts, pine nuts, sesame seeds, sunflower seeds, and flaxseed. Beverages Aim for 6 eight-ounce glasses of water per day. Limit coffee to 3-5 eight-ounce cups per day. Limit caffeinated beverages that have added calories, such as soda and energy drinks. If you drink alcohol: Limit how much you have to: 0-1 drink a day if you are male. 0-2 drinks a day if you are male. Know how much alcohol is in your drink. In the [place] one drink is one 12 oz bottle of beer (355 mL), one 5 oz glass of wine (  148 mL), or one 1 oz glass of hard liquor (44 mL). Seasoning and other foods Try not to add too much salt to your food. Try using herbs and spices instead of salt. Try not to add sugar to food. This information is based on [place] nutrition guidelines. To learn more, visit [employer]. Exact amounts may vary. You may need different amounts. This information is not intended to replace advice given to you by your health care provider. Make sure you discuss any questions you have with your health care provider. Document Revised: 11/13/2021 Document Reviewed: 11/13/2021 Elsevier Patient Education  2024 [employer].

## 2023-03-15 ENCOUNTER — Other Ambulatory Visit: Payer: Self-pay

## 2023-03-15 ENCOUNTER — Ambulatory Visit (INDEPENDENT_AMBULATORY_CARE_PROVIDER_SITE_OTHER): Payer: Commercial Managed Care - PPO | Admitting: Nurse Practitioner

## 2023-03-15 ENCOUNTER — Encounter: Payer: Self-pay | Admitting: Nurse Practitioner

## 2023-03-15 VITALS — BP 106/73 | HR 73 | Temp 97.9°F | Ht 69.3 in | Wt 177.2 lb

## 2023-03-15 DIAGNOSIS — G43009 Migraine without aura, not intractable, without status migrainosus: Secondary | ICD-10-CM

## 2023-03-15 DIAGNOSIS — E7800 Pure hypercholesterolemia, unspecified: Secondary | ICD-10-CM | POA: Diagnosis not present

## 2023-03-15 DIAGNOSIS — L409 Psoriasis, unspecified: Secondary | ICD-10-CM | POA: Diagnosis not present

## 2023-03-15 DIAGNOSIS — Z0000 Encounter for general adult medical examination without abnormal findings: Secondary | ICD-10-CM

## 2023-03-15 MED ORDER — RIZATRIPTAN BENZOATE 10 MG PO TABS
10.0000 mg | ORAL_TABLET | ORAL | 4 refills | Status: AC | PRN
  Filled 2023-03-15: qty 20, 23d supply, fill #0

## 2023-03-15 MED ORDER — AMITRIPTYLINE HCL 10 MG PO TABS
10.0000 mg | ORAL_TABLET | Freq: Every day | ORAL | 4 refills | Status: AC
  Filled 2023-03-15 – 2023-05-10 (×2): qty 90, 90d supply, fill #0
  Filled 2023-08-08: qty 90, 90d supply, fill #1

## 2023-03-15 NOTE — Progress Notes (Signed)
BP 106/73   Pulse 73   Temp 97.9 F (36.6 C) (Oral)   Ht 5' 9.3" (1.76 m)   Wt 177 lb 3.2 oz (80.4 kg)   SpO2 99%   BMI 25.94 kg/m    Subjective:    Patient ID: Jesse Clark, male    DOB: December 06, 1985, 38 y.o.   MRN: [id number]  HPI: Jesse Clark is a 38 y.o. male presenting on 03/15/2023 for comprehensive medical examination. Current medical complaints include:none  He currently lives with: significant other + two children (14 months and 85 years old) Interim Problems from his last visit: no  MIGRAINES Continues on Amitriptyline and Maxalt.  This overall keeps migraines stable. Duration: years Onset: gradual Severity: 6/10 Quality: sharp, aching, and throbbing Frequency: intermittent Location: behind right eye and right side of occiput Headache duration:6 to 8 hours Radiation: no Time of day headache occurs: varies Alleviating factors: Maxalt, Amtriptyline Aggravating factors: lack of sleep Headache status at time of visit: asymptomatic Treatments attempted: rest, heat, APAP, ibuprofen, triptans, and amitriptyline   Aura: no Nausea:  yes Vomiting: no Photophobia:  no Phonophobia:  yes Effect on social functioning:  no Numbers of missed days of school/work each month: 0 Confusion:  no Gait disturbance/ataxia:  no Behavioral changes:  no Fevers:  no   Functional Status Survey: Is the patient deaf or have difficulty hearing?: No Does the patient have difficulty seeing, even when wearing glasses/contacts?: No Does the patient have difficulty concentrating, remembering, or making decisions?: No Does the patient have difficulty walking or climbing stairs?: No Does the patient have difficulty dressing or bathing?: No Does the patient have difficulty doing errands alone such as visiting a doctor's office or shopping?: No  FALL RISK:    03/15/2023    8:21 AM 09/20/2022    8:43 AM 03/13/2022    2:35 PM 03/08/2021    9:29 AM 07/01/2019   11:35 AM  Fall Risk    Falls in the past year? 0 0 0 0 0  Number falls in past yr: 0 0 0 0   Injury with Fall? 0 0 0 0   Risk for fall due to : No Fall Risks No Fall Risks No Fall Risks No Fall Risks   Follow up Falls evaluation completed Falls evaluation completed Falls evaluation completed Falls evaluation completed     Depression Screen    03/15/2023    8:21 AM 10/19/2022   11:21 AM 09/20/2022    8:45 AM 03/13/2022    2:35 PM 03/08/2021    9:29 AM  Depression screen PHQ 2/9  Decreased Interest 0 0 0 0 0  Down, Depressed, Hopeless 0 0 0 0 0  PHQ - 2 Score 0 0 0 0 0  Altered sleeping 0 0 0 0 0  Tired, decreased energy 0 0 2 3   Change in appetite 0 0 0 0 0  Feeling bad or failure about yourself  0 0 0 0 0  Trouble concentrating 0 0 0 0 0  Moving slowly or fidgety/restless 0 0 0 0 0  Suicidal thoughts 0 0 0 0 0  PHQ-9 Score 0 0 2 3 0  Difficult doing work/chores Not difficult at all Not difficult at all Somewhat difficult Not difficult at all Not difficult at all      03/15/2023    8:22 AM 10/19/2022   11:21 AM 09/20/2022    8:45 AM 03/13/2022    2:36 PM  GAD 7 : Generalized Anxiety  Score  Nervous, Anxious, on Edge 0 0 0 0  Control/stop worrying 0 0 0 0  Worry too much - different things 0 0 0 0  Trouble relaxing 0 0 0 0  Restless 0 0 0 0  Easily annoyed or irritable 0 0 0 0  Afraid - awful might happen 0 0 0 0  Total GAD 7 Score 0 0 0 0  Anxiety Difficulty Not difficult at all Not difficult at all Not difficult at all Not difficult at all   Past Medical History:  Past Medical History:  Diagnosis Date   Allergy    PCN, Sulfa, Ceclor   Depression    Teenage years   Psoriasis     Surgical History:  Past Surgical History:  Procedure Laterality Date   CHOLECYSTECTOMY  2021   TONSILLECTOMY      Medications:  Current Outpatient Medications on File Prior to Visit  Medication Sig   Multiple Vitamin (MULTI-VITAMIN) tablet Take 1 tablet by mouth daily.    No current facility-administered  medications on file prior to visit.    Allergies:  Allergies  Allergen Reactions   Ceclor [Cefaclor] Hives   Elemental Sulfur Hives   Penicillins Hives    Social History:  Social History   Socioeconomic History   Marital status: Married    Spouse name: Not on file   Number of children: Not on file   Years of education: Not on file   Highest education level: Bachelor's degree (e.g., BA, AB, BS)  Occupational History   Not on file  Tobacco Use   Smoking status: Former    Current packs/day: 0.50    Average packs/day: 0.5 packs/day for 2.0 years (1.0 ttl pk-yrs)    Types: Cigarettes   Smokeless tobacco: Never  Vaping Use   Vaping status: Never Used  Substance and Sexual Activity   Alcohol use: Yes    Alcohol/week: 2.0 standard drinks of alcohol    Types: 2 Cans of beer per week   Drug use: No   Sexual activity: Yes    Comment: Monogamous  Other Topics Concern   Not on file  Social History Narrative   Not on file   Social Drivers of Health   Financial Resource Strain: Low Risk  (07/06/2022)   Overall Financial Resource Strain (CARDIA)    Difficulty of Paying Living Expenses: Not hard at all  Food Insecurity: No Food Insecurity (07/06/2022)   Hunger Vital Sign    Worried About Running Out of Food in the Last Year: Never true    Ran Out of Food in the Last Year: Never true  Transportation Needs: No Transportation Needs (07/06/2022)   PRAPARE - Administrator, Civil Service (Medical): No    Lack of Transportation (Non-Medical): No  Physical Activity: Insufficiently Active (07/06/2022)   Exercise Vital Sign    Days of Exercise per Week: 3 days    Minutes of Exercise per Session: 30 min  Stress: No Stress Concern Present (07/06/2022)   [employer] of Occupational Health - Occupational Stress Questionnaire    Feeling of Stress : Not at all  Social Connections: Moderately Integrated (07/06/2022)   Social Connection and Isolation Panel [NHANES]     Frequency of Communication with Friends and Family: Once a week    Frequency of Social Gatherings with Friends and Family: Once a week    Attends Religious Services: More than 4 times per year    Active Member of [employer] or Organizations:  Yes    Attends Banker Meetings: More than 4 times per year    Marital Status: Married  Catering manager Violence: Not on file   Social History   Tobacco Use  Smoking Status Former   Current packs/day: 0.50   Average packs/day: 0.5 packs/day for 2.0 years (1.0 ttl pk-yrs)   Types: Cigarettes  Smokeless Tobacco Never   Social History   Substance and Sexual Activity  Alcohol Use Yes   Alcohol/week: 2.0 standard drinks of alcohol   Types: 2 Cans of beer per week    Family History:  Family History  Problem Relation Age of Onset   Hyperthyroidism Mother    Hypertension Father    Hyperlipidemia Father    Cancer Maternal Grandfather    Cancer Maternal Grandmother     Past medical history, surgical history, medications, allergies, family history and social history reviewed with patient today and changes made to appropriate areas of the chart.   ROS All other ROS negative except what is listed above and in the HPI.      Objective:    BP 106/73   Pulse 73   Temp 97.9 F (36.6 C) (Oral)   Ht 5' 9.3" (1.76 m)   Wt 177 lb 3.2 oz (80.4 kg)   SpO2 99%   BMI 25.94 kg/m   Wt Readings from Last 3 Encounters:  03/15/23 177 lb 3.2 oz (80.4 kg)  10/19/22 183 lb 6.4 oz (83.2 kg)  09/20/22 185 lb 3.2 oz (84 kg)    Physical Exam Vitals and nursing note reviewed.  Constitutional:      General: He is awake. He is not in acute distress.    Appearance: He is well-developed and well-groomed. He is not ill-appearing or toxic-appearing.  HENT:     Head: Normocephalic and atraumatic.     Right Ear: Hearing, tympanic membrane, ear canal and external ear normal. No drainage.     Left Ear: Hearing, tympanic membrane, ear canal and external  ear normal. No drainage.     Nose: Nose normal.     Mouth/Throat:     Pharynx: Uvula midline.  Eyes:     General: Lids are normal.        Right eye: No discharge.        Left eye: No discharge.     Extraocular Movements: Extraocular movements intact.     Conjunctiva/sclera: Conjunctivae normal.     Pupils: Pupils are equal, round, and reactive to light.     Visual Fields: Right eye visual fields normal and left eye visual fields normal.  Neck:     Thyroid: No thyromegaly.     Vascular: No carotid bruit or JVD.     Trachea: Trachea normal.  Cardiovascular:     Rate and Rhythm: Normal rate and regular rhythm.     Heart sounds: Normal heart sounds, S1 normal and S2 normal. No murmur heard.    No gallop.  Pulmonary:     Effort: Pulmonary effort is normal. No accessory muscle usage or respiratory distress.     Breath sounds: Normal breath sounds.  Abdominal:     General: Bowel sounds are normal.     Palpations: Abdomen is soft. There is no hepatomegaly or splenomegaly.     Tenderness: There is no abdominal tenderness.  Musculoskeletal:        General: Normal range of motion.     Cervical back: Normal range of motion and neck supple.  Right lower leg: No edema.     Left lower leg: No edema.  Lymphadenopathy:     Head:     Right side of head: No submental, submandibular, tonsillar, preauricular or posterior auricular adenopathy.     Left side of head: No submental, submandibular, tonsillar, preauricular or posterior auricular adenopathy.     Cervical: No cervical adenopathy.  Skin:    General: Skin is warm and dry.     Capillary Refill: Capillary refill takes less than 2 seconds.     Findings: No rash.  Neurological:     Mental Status: He is alert and oriented to person, place, and time.     Gait: Gait is intact.     Deep Tendon Reflexes: Reflexes are normal and symmetric.     Reflex Scores:      Brachioradialis reflexes are 2+ on the right side and 2+ on the left side.       Patellar reflexes are 2+ on the right side and 2+ on the left side. Psychiatric:        Attention and Perception: Attention normal.        Mood and Affect: Mood normal.        Speech: Speech normal.        Behavior: Behavior normal. Behavior is cooperative.        Thought Content: Thought content normal.        Cognition and Memory: Cognition normal.    Results for orders placed or performed in visit on 03/13/22  CBC with Differential/Platelet   Collection Time: 03/13/22  3:13 PM  Result Value Ref Range   WBC 7.7 3.4 - 10.8 x10E3/uL   RBC 5.62 4.14 - 5.80 x10E6/uL   Hemoglobin 16.2 13.0 - 17.7 g/dL   Hematocrit 40.1 02.7 - 51.0 %   MCV 83 79 - 97 fL   MCH 28.8 26.6 - 33.0 pg   MCHC 34.8 31.5 - 35.7 g/dL   RDW 25.3 66.4 - 40.3 %   Platelets 221 150 - 450 x10E3/uL   Neutrophils 64 Not Estab. %   Lymphs 26 Not Estab. %   Monocytes 7 Not Estab. %   Eos 2 Not Estab. %   Basos 1 Not Estab. %   Neutrophils Absolute 4.9 1.4 - 7.0 x10E3/uL   Lymphocytes Absolute 2.0 0.7 - 3.1 x10E3/uL   Monocytes Absolute 0.5 0.1 - 0.9 x10E3/uL   EOS (ABSOLUTE) 0.1 0.0 - 0.4 x10E3/uL   Basophils Absolute 0.1 0.0 - 0.2 x10E3/uL   Immature Granulocytes 0 Not Estab. %   Immature Grans (Abs) 0.0 0.0 - 0.1 x10E3/uL  Comprehensive metabolic panel   Collection Time: 03/13/22  3:13 PM  Result Value Ref Range   Glucose 81 70 - 99 mg/dL   BUN 14 6 - 20 mg/dL   Creatinine, Ser 4.74 0.76 - 1.27 mg/dL   eGFR 259 >56 LO/VFI/4.33   BUN/Creatinine Ratio 15 9 - 20   Sodium 142 134 - 144 mmol/L   Potassium 4.4 3.5 - 5.2 mmol/L   Chloride 99 96 - 106 mmol/L   CO2 25 20 - 29 mmol/L   Calcium 9.7 8.7 - 10.2 mg/dL   Total Protein 7.3 6.0 - 8.5 g/dL   Albumin 4.9 4.1 - 5.1 g/dL   Globulin, Total 2.4 1.5 - 4.5 g/dL   Albumin/Globulin Ratio 2.0 1.2 - 2.2   Bilirubin Total 0.5 0.0 - 1.2 mg/dL   Alkaline Phosphatase 66 44 - 121 IU/L   AST  23 0 - 40 IU/L   ALT 16 0 - 44 IU/L  Lipid Panel w/o Chol/HDL Ratio    Collection Time: 03/13/22  3:13 PM  Result Value Ref Range   Cholesterol, Total 228 (H) 100 - 199 mg/dL   Triglycerides 423 0 - 149 mg/dL   HDL 88 >53 mg/dL   VLDL Cholesterol Cal 22 5 - 40 mg/dL   LDL Chol Calc (NIH) 614 (H) 0 - 99 mg/dL  TSH   Collection Time: 03/13/22  3:13 PM  Result Value Ref Range   TSH 1.060 0.450 - 4.500 uIU/mL  Hepatitis C antibody   Collection Time: 03/13/22  3:13 PM  Result Value Ref Range   Hep C Virus Ab Non Reactive Non Reactive  HIV Antibody (routine testing w rflx)   Collection Time: 03/13/22  3:13 PM  Result Value Ref Range   HIV Screen 4th Generation wRfx Non Reactive Non Reactive      Assessment & Plan:   Problem List Items Addressed This Visit       Cardiovascular and Mediastinum   Migraine - Primary   Chronic and stable.  No red flags on neuro exam.  Current regimen continues to offer benefit.  Continue Amitriptyline 10 MG nightly and Maxalt as needed. Discussed this plan of care and medications, educated on all.  Refills sent.  If any worsening or poor control will send to neurology.      Relevant Medications   amitriptyline (ELAVIL) 10 MG tablet   rizatriptan (MAXALT) 10 MG tablet   Other Relevant Orders   CBC with Differential/Platelet   TSH     Musculoskeletal and Integument   Psoriasis   Chronic, ongoing, followed by dermatology.  Continue this collaboration.  Labs today.      Relevant Orders   CBC with Differential/Platelet   TSH     Other   Elevated low density lipoprotein (LDL) cholesterol level   Noted on past labs, has been working on diet changes and regular exercise.  Recheck levels to day.  No medications at this time.      Relevant Orders   Comprehensive metabolic panel   Lipid Panel w/o Chol/HDL Ratio   Other Visit Diagnoses       Encounter for annual physical exam       Annual physical today with labs and health maintenance reviewed, discussed with patient.       LABORATORY TESTING:  Health  maintenance labs ordered today as discussed above.   IMMUNIZATIONS:   - Tdap: Tetanus vaccination status reviewed: last tetanus booster within 10 years. - Influenza: Up to date - Pneumovax: Not applicable - Prevnar: Not applicable - Zostavax vaccine: Not applicable  SCREENING: - Colonoscopy: Not applicable  Discussed with patient purpose of the colonoscopy is to detect colon cancer at curable precancerous or early stages   - AAA Screening: Not applicable  -Hearing Test: Not applicable  -Spirometry: Not applicable   PATIENT COUNSELING:    Sexuality: Discussed sexually transmitted diseases, partner selection, use of condoms, avoidance of unintended pregnancy  and contraceptive alternatives.   Advised to avoid cigarette smoking.  I discussed with the patient that most people either abstain from alcohol or drink within safe limits (<=14/week and <=4 drinks/occasion for males, <=7/weeks and <= 3 drinks/occasion for females) and that the risk for alcohol disorders and other health effects rises proportionally with the number of drinks per week and how often a drinker exceeds daily limits.  Discussed cessation/primary prevention of drug  use and availability of treatment for abuse.   Diet: Encouraged to adjust caloric intake to maintain  or achieve ideal body weight, to reduce intake of dietary saturated fat and total fat, to limit sodium intake by avoiding high sodium foods and not adding table salt, and to maintain adequate dietary potassium and calcium preferably from fresh fruits, vegetables, and low-fat dairy products.    Stressed the importance of regular exercise  Injury prevention: Discussed safety belts, safety helmets, smoke detector, smoking near bedding or upholstery.   Dental health: Discussed importance of regular tooth brushing, flossing, and dental visits.   Follow up plan: NEXT PREVENTATIVE PHYSICAL DUE IN 1 YEAR. Return in about 1 year (around 03/14/2024) for Annual  Physical.

## 2023-03-15 NOTE — Assessment & Plan Note (Signed)
Chronic and stable.  No red flags on neuro exam.  Current regimen continues to offer benefit.  Continue Amitriptyline 10 MG nightly and Maxalt as needed. Discussed this plan of care and medications, educated on all.  Refills sent.  If any worsening or poor control will send to neurology.

## 2023-03-15 NOTE — Assessment & Plan Note (Signed)
Noted on past labs, has been working on diet changes and regular exercise.  Recheck levels to day.  No medications at this time.

## 2023-03-15 NOTE — Assessment & Plan Note (Signed)
Chronic, ongoing, followed by dermatology.  Continue this collaboration.  Labs today.

## 2023-03-16 ENCOUNTER — Encounter: Payer: Self-pay | Admitting: Nurse Practitioner

## 2023-03-16 LAB — COMPREHENSIVE METABOLIC PANEL
ALT: 19 [IU]/L (ref 0–44)
AST: 20 [IU]/L (ref 0–40)
Albumin: 4.4 g/dL (ref 4.1–5.1)
Alkaline Phosphatase: 60 [IU]/L (ref 44–121)
BUN/Creatinine Ratio: 10 (ref 9–20)
BUN: 8 mg/dL (ref 6–20)
Bilirubin Total: 0.6 mg/dL (ref 0.0–1.2)
CO2: 25 mmol/L (ref 20–29)
Calcium: 9.3 mg/dL (ref 8.7–10.2)
Chloride: 102 mmol/L (ref 96–106)
Creatinine, Ser: 0.84 mg/dL (ref 0.76–1.27)
Globulin, Total: 2.4 g/dL (ref 1.5–4.5)
Glucose: 77 mg/dL (ref 70–99)
Potassium: 3.9 mmol/L (ref 3.5–5.2)
Sodium: 142 mmol/L (ref 134–144)
Total Protein: 6.8 g/dL (ref 6.0–8.5)
eGFR: 117 mL/min/{1.73_m2} (ref >59)

## 2023-03-16 LAB — CBC WITH DIFFERENTIAL/PLATELET
Basophils Absolute: 0 10*3/uL (ref 0.0–0.2)
Basos: 1 %
EOS (ABSOLUTE): 0.1 10*3/uL (ref 0.0–0.4)
Eos: 2 %
Hematocrit: 44.4 % (ref 37.5–51.0)
Hemoglobin: 14.9 g/dL (ref 13.0–17.7)
Immature Grans (Abs): 0 10*3/uL (ref 0.0–0.1)
Immature Granulocytes: 0 %
Lymphocytes Absolute: 1.3 10*3/uL (ref 0.7–3.1)
Lymphs: 30 %
MCH: 28.8 pg (ref 26.6–33.0)
MCHC: 33.6 g/dL (ref 31.5–35.7)
MCV: 86 fL (ref 79–97)
Monocytes Absolute: 0.4 10*3/uL (ref 0.1–0.9)
Monocytes: 10 %
Neutrophils Absolute: 2.4 10*3/uL (ref 1.4–7.0)
Neutrophils: 57 %
Platelets: 193 10*3/uL (ref 150–450)
RBC: 5.17 x10E6/uL (ref 4.14–5.80)
RDW: 12.9 % (ref 11.6–15.4)
WBC: 4.3 10*3/uL (ref 3.4–10.8)

## 2023-03-16 LAB — TSH: TSH: 1.15 u[IU]/mL (ref 0.450–4.500)

## 2023-03-16 LAB — LIPID PANEL W/O CHOL/HDL RATIO
Cholesterol, Total: 131 mg/dL (ref 100–199)
HDL: 57 mg/dL (ref >39)
LDL Chol Calc (NIH): 59 mg/dL (ref 0–99)
Triglycerides: 76 mg/dL (ref 0–149)
VLDL Cholesterol Cal: 15 mg/dL (ref 5–40)

## 2023-03-16 NOTE — Progress Notes (Signed)
Contacted via MyChart   Good morning Jesse Clark your labs have returned and WOW.  Look at that lipid panel.  Your LDL went from 118 to 59 and total cholesterol from 228 to 131.  Whatever you are doing continue it as it is working.  Any questions?  Overall very good labs. Keep being stellar!!  Thank you for allowing me to participate in your care.  I appreciate you. Kindest regards, Shanesha Bednarz

## 2023-04-22 ENCOUNTER — Ambulatory Visit: Payer: Commercial Managed Care - PPO | Admitting: Nurse Practitioner

## 2023-05-10 ENCOUNTER — Other Ambulatory Visit: Payer: Self-pay

## 2023-07-02 ENCOUNTER — Telehealth: Admitting: Physician Assistant

## 2023-07-02 ENCOUNTER — Other Ambulatory Visit: Payer: Self-pay

## 2023-07-02 DIAGNOSIS — J208 Acute bronchitis due to other specified organisms: Secondary | ICD-10-CM | POA: Diagnosis not present

## 2023-07-02 DIAGNOSIS — B9689 Other specified bacterial agents as the cause of diseases classified elsewhere: Secondary | ICD-10-CM

## 2023-07-02 MED ORDER — AZITHROMYCIN 250 MG PO TABS
ORAL_TABLET | ORAL | 0 refills | Status: AC
Start: 2023-07-02 — End: 2023-07-07
  Filled 2023-07-02: qty 6, 5d supply, fill #0

## 2023-07-02 MED ORDER — BENZONATATE 100 MG PO CAPS
100.0000 mg | ORAL_CAPSULE | Freq: Three times a day (TID) | ORAL | 0 refills | Status: AC | PRN
  Filled 2023-07-02: qty 30, 5d supply, fill #0

## 2023-07-02 MED ORDER — ALBUTEROL SULFATE HFA 108 (90 BASE) MCG/ACT IN AERS
1.0000 | INHALATION_SPRAY | Freq: Four times a day (QID) | RESPIRATORY_TRACT | 0 refills | Status: AC | PRN
  Filled 2023-07-02: qty 6.7, 30d supply, fill #0

## 2023-07-02 MED ORDER — PREDNISONE 20 MG PO TABS
40.0000 mg | ORAL_TABLET | Freq: Every day | ORAL | 0 refills | Status: AC
  Filled 2023-07-02: qty 10, 5d supply, fill #0

## 2023-07-02 NOTE — Progress Notes (Signed)
 E-Visit for Cough   We are sorry that you are not feeling well.  Here is how we plan to help!  Based on your presentation I believe you most likely have A cough due to bacteria.  When patients have a fever and a productive cough with a change in color or increased sputum production, we are concerned about bacterial bronchitis.  If left untreated it can progress to pneumonia.  If your symptoms do not improve with your treatment plan it is important that you contact your provider.   I have prescribed Azithromyin 250 mg: two tablets now and then one tablet daily for 4 additonal days    In addition you may use A non-prescription cough medication called Mucinex DM: take 2 tablets every 12 hours. and A prescription cough medication called Tessalon Perles 100mg . You may take 1-2 capsules every 8 hours as needed for your cough.  I have also prescribed Prednisone 20mg  Take 2 tablets (40mg ) daily for 5 days  AND  Albuterol inhaler Use 1-2 puffs every 6 hours as needed for shortness of breath, chest tightness, and/or wheezing.   From your responses in the eVisit questionnaire you describe inflammation in the upper respiratory tract which is causing a significant cough.  This is commonly called Bronchitis and has four common causes:   Allergies Viral Infections Acid Reflux Bacterial Infection Allergies, viruses and acid reflux are treated by controlling symptoms or eliminating the cause. An example might be a cough caused by taking certain blood pressure medications. You stop the cough by changing the medication. Another example might be a cough caused by acid reflux. Controlling the reflux helps control the cough.  USE OF BRONCHODILATOR ("RESCUE") INHALERS: There is a risk from using your bronchodilator too frequently.  The risk is that over-reliance on a medication which only relaxes the muscles surrounding the breathing tubes can reduce the effectiveness of medications prescribed to reduce swelling  and congestion of the tubes themselves.  Although you feel brief relief from the bronchodilator inhaler, your asthma may actually be worsening with the tubes becoming more swollen and filled with mucus.  This can delay other crucial treatments, such as oral steroid medications. If you need to use a bronchodilator inhaler daily, several times per day, you should discuss this with your provider.  There are probably better treatments that could be used to keep your asthma under control.     HOME CARE Only take medications as instructed by your medical team. Complete the entire course of an antibiotic. Drink plenty of fluids and get plenty of rest. Avoid close contacts especially the very young and the elderly Cover your mouth if you cough or cough into your sleeve. Always remember to wash your hands A steam or ultrasonic humidifier can help congestion.   GET HELP RIGHT AWAY IF: You develop worsening fever. You become short of breath You cough up blood. Your symptoms persist after you have completed your treatment plan MAKE SURE YOU  Understand these instructions. Will watch your condition. Will get help right away if you are not doing well or get worse.    Thank you for choosing an e-visit.  Your e-visit answers were reviewed by a board certified advanced clinical practitioner to complete your personal care plan. Depending upon the condition, your plan could have included both over the counter or prescription medications.  Please review your pharmacy choice. Make sure the pharmacy is open so you can pick up prescription now. If there is a problem, you  may contact your provider through [employer] and have the prescription routed to another pharmacy.  Your safety is important to [place]. If you have drug allergies check your prescription carefully.   For the next 24 hours you can use MyChart to ask questions about today's visit, request a non-urgent call back, or ask for a work or school  excuse. You will get an email in the next two days asking about your experience. I hope that your e-visit has been valuable and will speed your recovery.   I have spent 5 minutes in review of e-visit questionnaire, review and updating patient chart, medical decision making and response to patient.   Margaretann Loveless, PA-C

## 2023-08-08 ENCOUNTER — Other Ambulatory Visit: Payer: Self-pay

## 2023-11-11 ENCOUNTER — Other Ambulatory Visit: Payer: Self-pay

## 2023-11-13 ENCOUNTER — Other Ambulatory Visit: Payer: Self-pay

## 2023-12-07 ENCOUNTER — Other Ambulatory Visit: Payer: Self-pay | Admitting: Medical Genetics

## 2023-12-07 DIAGNOSIS — Z006 Encounter for examination for normal comparison and control in clinical research program: Secondary | ICD-10-CM

## 2024-03-17 ENCOUNTER — Encounter: Payer: Self-pay | Admitting: Nurse Practitioner

## 2024-04-13 ENCOUNTER — Encounter: Payer: Self-pay | Admitting: Nurse Practitioner
